# Patient Record
Sex: Female | Born: 1985
Health system: Southern US, Community
[De-identification: ages and names within clinical notes are randomized; demographics above are authoritative.]

## PROBLEM LIST (undated history)

## (undated) DIAGNOSIS — I10 Essential (primary) hypertension: Secondary | ICD-10-CM

## (undated) DIAGNOSIS — Z8632 Personal history of gestational diabetes: Secondary | ICD-10-CM

## (undated) HISTORY — PX: NO PAST SURGERIES: SHX2092

## (undated) HISTORY — DX: Personal history of gestational diabetes: Z86.32

---
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
## 2004-01-05 ENCOUNTER — Ambulatory Visit: Payer: Self-pay | Admitting: Internal Medicine

## 2004-05-18 ENCOUNTER — Emergency Department (HOSPITAL_COMMUNITY): Admission: EM | Admit: 2004-05-18 | Discharge: 2004-05-18 | Payer: Self-pay | Admitting: Emergency Medicine

## 2006-02-18 ENCOUNTER — Ambulatory Visit: Payer: Self-pay | Admitting: Internal Medicine

## 2006-07-31 ENCOUNTER — Emergency Department (HOSPITAL_COMMUNITY): Admission: EM | Admit: 2006-07-31 | Discharge: 2006-07-31 | Payer: Self-pay | Admitting: Emergency Medicine

## 2006-11-07 ENCOUNTER — Ambulatory Visit: Payer: Self-pay | Admitting: Internal Medicine

## 2006-11-07 DIAGNOSIS — G43009 Migraine without aura, not intractable, without status migrainosus: Secondary | ICD-10-CM | POA: Insufficient documentation

## 2006-11-14 ENCOUNTER — Ambulatory Visit: Payer: Self-pay | Admitting: Cardiology

## 2006-11-15 ENCOUNTER — Encounter (INDEPENDENT_AMBULATORY_CARE_PROVIDER_SITE_OTHER): Payer: Self-pay | Admitting: *Deleted

## 2006-12-05 ENCOUNTER — Ambulatory Visit: Payer: Self-pay | Admitting: Internal Medicine

## 2007-02-09 ENCOUNTER — Ambulatory Visit: Payer: Self-pay | Admitting: Internal Medicine

## 2007-03-27 ENCOUNTER — Telehealth (INDEPENDENT_AMBULATORY_CARE_PROVIDER_SITE_OTHER): Payer: Self-pay | Admitting: *Deleted

## 2007-04-30 ENCOUNTER — Telehealth (INDEPENDENT_AMBULATORY_CARE_PROVIDER_SITE_OTHER): Payer: Self-pay | Admitting: *Deleted

## 2007-05-04 ENCOUNTER — Telehealth (INDEPENDENT_AMBULATORY_CARE_PROVIDER_SITE_OTHER): Payer: Self-pay | Admitting: *Deleted

## 2007-05-08 ENCOUNTER — Ambulatory Visit: Payer: Self-pay | Admitting: Internal Medicine

## 2007-05-13 ENCOUNTER — Ambulatory Visit: Payer: Self-pay | Admitting: Internal Medicine

## 2007-05-13 ENCOUNTER — Encounter (INDEPENDENT_AMBULATORY_CARE_PROVIDER_SITE_OTHER): Payer: Self-pay | Admitting: *Deleted

## 2007-05-19 ENCOUNTER — Telehealth (INDEPENDENT_AMBULATORY_CARE_PROVIDER_SITE_OTHER): Payer: Self-pay | Admitting: *Deleted

## 2007-06-26 ENCOUNTER — Telehealth: Payer: Self-pay | Admitting: Internal Medicine

## 2007-07-15 ENCOUNTER — Ambulatory Visit: Payer: Self-pay | Admitting: Internal Medicine

## 2007-07-15 DIAGNOSIS — L2089 Other atopic dermatitis: Secondary | ICD-10-CM | POA: Insufficient documentation

## 2007-08-07 ENCOUNTER — Ambulatory Visit: Payer: Self-pay | Admitting: Internal Medicine

## 2007-08-27 ENCOUNTER — Ambulatory Visit: Payer: Self-pay | Admitting: Internal Medicine

## 2007-08-27 LAB — CONVERTED CEMR LAB
Beta hcg, urine, semiquantitative: NEGATIVE
Blood in Urine, dipstick: NEGATIVE
Glucose, Urine, Semiquant: NEGATIVE
Ketones, urine, test strip: NEGATIVE
pH: 7.5

## 2007-08-31 LAB — CONVERTED CEMR LAB
AST: 14 units/L (ref 0–37)
Albumin: 3.5 g/dL (ref 3.5–5.2)
Alkaline Phosphatase: 51 units/L (ref 39–117)
Basophils Absolute: 0.2 10*3/uL — ABNORMAL HIGH (ref 0.0–0.1)
Bilirubin, Direct: 0.1 mg/dL (ref 0.0–0.3)
CO2: 29 meq/L (ref 19–32)
Calcium: 9.3 mg/dL (ref 8.4–10.5)
Chloride: 104 meq/L (ref 96–112)
Eosinophils Absolute: 0.1 10*3/uL (ref 0.0–0.7)
Free T4: 1.2 ng/dL (ref 0.6–1.6)
Hemoglobin: 13.7 g/dL (ref 12.0–15.0)
Hgb A1c MFr Bld: 5.2 % (ref 4.6–6.0)
Monocytes Absolute: 0.7 10*3/uL (ref 0.1–1.0)
Platelets: 332 10*3/uL (ref 150–400)
Potassium: 3.5 meq/L (ref 3.5–5.1)
Sodium: 140 meq/L (ref 135–145)
TSH: 0.93 microintl units/mL (ref 0.35–5.50)
Total Protein: 6.7 g/dL (ref 6.0–8.3)

## 2007-10-13 ENCOUNTER — Ambulatory Visit: Payer: Self-pay | Admitting: Internal Medicine

## 2007-10-15 ENCOUNTER — Telehealth: Payer: Self-pay | Admitting: Internal Medicine

## 2007-10-20 ENCOUNTER — Ambulatory Visit: Payer: Self-pay | Admitting: Internal Medicine

## 2007-11-03 ENCOUNTER — Encounter (INDEPENDENT_AMBULATORY_CARE_PROVIDER_SITE_OTHER): Payer: Self-pay | Admitting: *Deleted

## 2007-11-03 ENCOUNTER — Ambulatory Visit: Payer: Self-pay | Admitting: Family Medicine

## 2007-11-04 ENCOUNTER — Ambulatory Visit: Payer: Self-pay | Admitting: Family Medicine

## 2007-11-04 ENCOUNTER — Ambulatory Visit: Payer: Self-pay | Admitting: Cardiology

## 2007-11-04 LAB — CONVERTED CEMR LAB
Basophils Relative: 0.4 % (ref 0.0–3.0)
Eosinophils Absolute: 0.1 10*3/uL (ref 0.0–0.7)
MCV: 92.5 fL (ref 78.0–100.0)
Monocytes Relative: 7.8 % (ref 3.0–12.0)
Neutrophils Relative %: 85.7 % — ABNORMAL HIGH (ref 43.0–77.0)
RBC: 4.28 M/uL (ref 3.87–5.11)
WBC: 13.2 10*3/uL — ABNORMAL HIGH (ref 4.5–10.5)

## 2007-11-06 ENCOUNTER — Ambulatory Visit: Payer: Self-pay | Admitting: Internal Medicine

## 2007-11-06 ENCOUNTER — Ambulatory Visit: Payer: Self-pay | Admitting: Family Medicine

## 2007-12-22 ENCOUNTER — Telehealth: Payer: Self-pay | Admitting: Internal Medicine

## 2008-01-27 ENCOUNTER — Telehealth: Payer: Self-pay | Admitting: Family Medicine

## 2008-01-28 ENCOUNTER — Encounter: Admission: RE | Admit: 2008-01-28 | Discharge: 2008-04-27 | Payer: Self-pay | Admitting: Family Medicine

## 2008-02-04 ENCOUNTER — Encounter: Payer: Self-pay | Admitting: Family Medicine

## 2008-02-08 ENCOUNTER — Ambulatory Visit: Payer: Self-pay | Admitting: Internal Medicine

## 2008-03-07 ENCOUNTER — Encounter: Payer: Self-pay | Admitting: Family Medicine

## 2008-03-11 ENCOUNTER — Encounter: Payer: Self-pay | Admitting: Family Medicine

## 2008-04-07 ENCOUNTER — Emergency Department (HOSPITAL_BASED_OUTPATIENT_CLINIC_OR_DEPARTMENT_OTHER): Admission: EM | Admit: 2008-04-07 | Discharge: 2008-04-07 | Payer: Self-pay | Admitting: Emergency Medicine

## 2008-04-11 ENCOUNTER — Telehealth (INDEPENDENT_AMBULATORY_CARE_PROVIDER_SITE_OTHER): Payer: Self-pay | Admitting: *Deleted

## 2008-04-11 ENCOUNTER — Ambulatory Visit: Payer: Self-pay | Admitting: Family Medicine

## 2008-05-05 ENCOUNTER — Ambulatory Visit: Payer: Self-pay | Admitting: Internal Medicine

## 2008-06-02 ENCOUNTER — Encounter (INDEPENDENT_AMBULATORY_CARE_PROVIDER_SITE_OTHER): Payer: Self-pay | Admitting: *Deleted

## 2008-06-02 ENCOUNTER — Ambulatory Visit: Payer: Self-pay | Admitting: Family Medicine

## 2008-06-03 ENCOUNTER — Telehealth: Payer: Self-pay | Admitting: Family Medicine

## 2008-06-09 ENCOUNTER — Encounter: Payer: Self-pay | Admitting: Family Medicine

## 2008-06-22 ENCOUNTER — Encounter: Admission: RE | Admit: 2008-06-22 | Discharge: 2008-06-22 | Payer: Self-pay | Admitting: Otolaryngology

## 2008-09-06 ENCOUNTER — Ambulatory Visit: Payer: Self-pay | Admitting: Family Medicine

## 2008-10-11 ENCOUNTER — Ambulatory Visit: Payer: Self-pay | Admitting: Internal Medicine

## 2008-10-11 DIAGNOSIS — J019 Acute sinusitis, unspecified: Secondary | ICD-10-CM | POA: Insufficient documentation

## 2008-12-07 ENCOUNTER — Ambulatory Visit: Payer: Self-pay | Admitting: Internal Medicine

## 2009-03-16 ENCOUNTER — Ambulatory Visit: Payer: Self-pay | Admitting: Internal Medicine

## 2009-03-21 ENCOUNTER — Ambulatory Visit: Payer: Self-pay | Admitting: Family

## 2010-02-16 ENCOUNTER — Ambulatory Visit
Admission: RE | Admit: 2010-02-16 | Discharge: 2010-02-16 | Payer: Self-pay | Source: Home / Self Care | Attending: Internal Medicine | Admitting: Internal Medicine

## 2010-02-20 NOTE — Assessment & Plan Note (Signed)
Summary: chest cold//takin OTC's//lch   Vital Signs:  Patient profile:   25 year old female Height:      65 inches Weight:      159.25 pounds BMI:     26.60 O2 Sat:      98 % on Room air Temp:     98.6 degrees F oral Pulse rate:   76 / minute Pulse rhythm:   regular Resp:     20 per minute BP sitting:   120 / 88  Vitals Entered By: Mervin Kung CMA (March 21, 2009 11:22 AM)  O2 Flow:  Room air CC: room 16  Cough since Friday. Comments Sore throat since Saturday and nasal congestion.   CC:  room 16  Cough since Friday.Marland Kitchen  History of Present Illness: Samantha Gould is a 25 year old female who presents with c/o nasal congestion/sinus pressure, cough (dry at times, wet at others)  Notes + HA and back soreness.  Denies fever.  has taken mucinex, theraflu, and amoxicillin which she started on friday 2/25 (left over from a sinus infection).  Notes that these symptoms started on Friday 2/25 with cough.  + sick contacts at church.    Allergies (verified): No Known Drug Allergies  Physical Exam  General:  Well-developed,well-nourished,in no acute distress; alert,appropriate and cooperative throughout examination Head:  Normocephalic and atraumatic without obvious abnormalities. No apparent alopecia or balding. Ears:  L TM with serous effusion,  R TM + erythema, + retraction Mouth:  mildy pharygeal erythema Neck:  No deformities, masses, or tenderness noted. Lungs:  Normal respiratory effort, chest expands symmetrically. Lungs are clear to auscultation, no crackles or wheezes. Heart:  Normal rate and regular rhythm. S1 and S2 normal without gallop, murmur, click, rub or other extra sounds.   Impression & Recommendations:  Problem # 1:  OTITIS MEDIA, RIGHT (ICD-382.9) Assessment New Not improving with amoxicillin with plan to change to cefdinir Her updated medication list for this problem includes:    Naprosyn 500 Mg Tabs (Naproxen) .Marland Kitchen... 1 by mouth bid    Cefdinir 300 Mg Caps  (Cefdinir) ..... One cap by mouth two times a day x 7 days  Problem # 2:  SINUSITIS - ACUTE-NOS (ICD-461.9) Assessment: New Plan cefdinir for an addition 7 days The following medications were removed from the medication list:    Nasacort Aq 55 Mcg/act Aers (Triamcinolone acetonide(nasal)) .Marland Kitchen... 2 sprays each nostril once daily Her updated medication list for this problem includes:    Cefdinir 300 Mg Caps (Cefdinir) ..... One cap by mouth two times a day x 7 days    Tessalon Perles 100 Mg Caps (Benzonatate) ..... One cap by mouth three times a day as needed cough  Complete Medication List: 1)  Depo Shots  2)  Flexeril 10 Mg Tabs (Cyclobenzaprine hcl) .Marland Kitchen.. 1 at night as needed 3)  Treximet 85-500 Mg Tabs (Sumatriptan-naproxen sodium) .Marland Kitchen.. 1 by mouth  w/ onset of migraine, repeat in 2 hours if needed. no more than 2 tabs a day 4)  Vicodin 5-500 Mg Tabs (Hydrocodone-acetaminophen) .Marland Kitchen.. 1 tab by mouth q4-6 as needed for pain 5)  Naprosyn 500 Mg Tabs (Naproxen) .Marland Kitchen.. 1 by mouth bid 6)  Cefdinir 300 Mg Caps (Cefdinir) .... One cap by mouth two times a day x 7 days 7)  Patanase Nasal Spray  .Marland Kitchen.. 2 sprays each nostril daily 8)  Tessalon Perles 100 Mg Caps (Benzonatate) .... One cap by mouth three times a day as needed cough  Patient Instructions: 1)  Call if fever over 101,if symptoms worsen or if they do not improve.  Prescriptions: TESSALON PERLES 100 MG CAPS (BENZONATATE) one cap by mouth three times a day as needed cough  #30 x 0   Entered and Authorized by:   Lemont Fillers FNP   Signed by:   Lemont Fillers FNP on 03/21/2009   Method used:   Electronically to        [facility] [phone][facility] (retail)       [address]       [place], [place]  [address]       Ph: [phone]       Fax: [phone]   RxID:   [id number] CEFDINIR 300 MG CAPS (CEFDINIR) one cap by mouth two times a day x 7 days  #14 x 0   Entered and Authorized by:   Lemont Fillers FNP   Signed by:   Lemont Fillers FNP on 03/21/2009   Method used:   [facility]ronically to        [facility] [phone]* (retail)       [address]       [place], [place]  [address]       Ph: [phone]       Fax: [phone]   RxID:   [id number]   Current Allergies (reviewed today): No known allergies

## 2010-02-20 NOTE — Letter (Signed)
Summary: Out of Work  [facility] [employer]  [address] [place], [place] [address]   Phone: [phone]  Fax: [phone]    March 21, 2009   Employee:  RONNICA DREESE    To Whom It May Concern:   For Medical reasons, please excuse the above named employee from work for the following dates:  Start:   3/1  End:   3/3  If you need additional information, please feel free to contact our office.         Sincerely,    Lemont Fillers FNP

## 2010-02-20 NOTE — Assessment & Plan Note (Signed)
Summary: DEPO SHOT//PH  Nurse Visit   Allergies: No Known Drug Allergies  Medication Administration  Injection # 1:    Medication: Depo-Provera 150mg     Diagnosis: CONTRACEPTIVE MANAGEMENT (ICD-V25.09)    Route: IM    Site: R deltoid    Exp Date: 03/22/2011    Lot #: [id number]    Mfr: Francisca December    Given by: Doristine Devoid (March 16, 2009 12:18 PM)  Orders Added: 1)  Depo-Provera 150mg  [J1055] 2)  Admin of Therapeutic Inj  intramuscular or subcutaneous [96[address]]   Medication Administration  Injection # 1:    Medication: Depo-Provera 150mg     Diagnosis: CONTRACEPTIVE MANAGEMENT (ICD-V25.09)    Route: IM    Site: R deltoid    Exp Date: 03/22/2011    Lot #: [id number]    Mfr: Francisca December    Given by: Doristine Devoid (March 16, 2009 12:18 PM)  Orders Added: 1)  Depo-Provera 150mg  [J1055] 2)  Admin of Therapeutic Inj  intramuscular or subcutaneous [[id number]]

## 2010-02-28 NOTE — Assessment & Plan Note (Signed)
Summary: migraines, talk abt tonsil removal///sph   Vital Signs:  Patient profile:   25 year old female Height:      65 inches Weight:      169.38 pounds BMI:     28.29 Temp:     98.2 degrees F oral Pulse rate:   64 / minute Pulse rhythm:   regular BP sitting:   120 / 84  (left arm) Cuff size:   regular  Vitals Entered By: Army Fossa CMA (February 16, 2010 3:42 PM) CC: Pr here w/ c/o sinus infection? Comments -drainage -HA - nasal congestion x 4 days  discuss [facility]   History of Present Illness: 4-5 days history of frontal headaches, sinus congestion, postnasal drip . DayQuil and NyQuil not helping much. Also wonders if she needs her tonsils removed, she reports an average of 4 or 5 URI-type of episodes a year, every time she has moderate tonsil pain.       Current Medications (verified): 1)  Treximet 85-500 Mg Tabs (Sumatriptan-Naproxen Sodium) .Marland Kitchen.. 1 By Mouth  W/ Onset of Migraine, Repeat in 2 Hours If Needed. No More Than 2 Tabs A Day 2)  Patanase Nasal Spray .Marland Kitchen.. 2 Sprays Each Nostril Daily 3)  Tri-Sprintec 0.18/0.215/0.25 Mg-35 Mcg Tabs (Norgestim-Eth Estrad Triphasic)  Allergies (verified): No Known Drug Allergies  Past History:  Past Medical History: Reviewed history from 08/27/2007 and no changes required. G0P0 Headache, migraines  Past Surgical History: Reviewed history from 05/13/2007 and no changes required. no  Social History: Single lives by self tobacco-- << 1/2 ppd ETOH--no  Review of Systems General:  Complains of fatigue; denies fever. Resp:  Denies cough, shortness of breath, and wheezing. GI:  Denies diarrhea, nausea, and vomiting. MS:  some neck soreness, no generalized myalgias.  Physical Exam  General:  alert and well-developed.  nontoxic Head:  face symmetric, maxillary sinuses not tender, frontal sinuses slightly tender Ears:  R ear normal and L ear normal.   Nose:  quite congested Mouth:   tonsils normal size, currently no redness or discharge Lungs:  Normal respiratory effort, chest expands symmetrically. Lungs are clear to auscultation, no crackles or wheezes. Heart:  normal rate, regular rhythm, and no murmur.     Impression & Recommendations:  Problem # 1:  SINUSITIS - ACUTE-NOS (ICD-461.9) symptoms consistent with sinusitis see  instructions  Tonsillectomy? the exam is pretty benign, she does not have more URIs than the  average person, I don't think is appropriate to proceed with a tonsillectomy. she already discussed the issue with a ENT , they did not recommend a tonsillectomy  Her updated medication list for this problem includes:    Amoxicillin 500 Mg Tabs (Amoxicillin) .Marland Kitchen... 2 by mouth two times a day  Complete Medication List: 1)  Treximet 85-500 Mg Tabs (Sumatriptan-naproxen sodium) .Marland Kitchen.. 1 by mouth  w/ onset of migraine, repeat in 2 hours if needed. no more than 2 tabs a day 2)  Patanase Nasal Spray  .Marland Kitchen.. 2 sprays each nostril daily 3)  Tri-sprintec 0.18/0.215/0.25 Mg-35 Mcg Tabs (Norgestim-eth estrad triphasic) 4)  Amoxicillin 500 Mg Tabs (Amoxicillin) .... 2 by mouth two times a day 5)  Prednisone 10 Mg Tabs (Prednisone) .... 4 by mouth once daily x 2;  3 by mouth once daily  x2;  2 by mouth once daily x 2 , 1 by mouth once daily x2  Patient Instructions: 1)  rest, fluids, tylenol 2)  prednisone  3)  amoxicillin 4)  mucinex D two times a day x few days 5)  call if no better next week, call if symptoms severe Prescriptions: TREXIMET 85-500 MG TABS (SUMATRIPTAN-NAPROXEN SODIUM) 1 by mouth  w/ onset of migraine, repeat in 2 hours if needed. No more than 2 tabs a day  #12 x 1   Entered and Authorized by:   Elita Quick E. Jazilyn Siegenthaler MD   Signed by:   Nolon Rod. Tanara Turvey MD on 02/16/2010   Method used:   Print then Give to Patient   RxID:   [id number] AMOXICILLIN 500 MG TABS (AMOXICILLIN) 2 by mouth two times a day  #40 x 0   Entered and Authorized by:   Nolon Rod. Paolo Okane MD    Signed by:   Nolon Rod. Nicole Defino MD on 02/16/2010   Method used:   Print then Give to Patient   RxID:   [id number] PREDNISONE 10 MG TABS (PREDNISONE) 4 by mouth once daily x 2;  3 by mouth once daily  x2;  2 by mouth once daily x 2 , 1 by mouth once daily x2  #20 x 0   Entered and Authorized by:   Nolon Rod. Nahdia Doucet MD   Signed by:   Nolon Rod. Lathon Adan MD on 02/16/2010   Method used:   Print then Give to Patient   RxID:   [id number]    Orders Added: 1)  Est. Patient Level III [94854]

## 2010-03-02 ENCOUNTER — Telehealth: Payer: Self-pay | Admitting: Internal Medicine

## 2010-03-08 NOTE — Progress Notes (Signed)
Summary: pt lost rx -needs new rx called in   Phone Note Call from Patient Call back at [phone]   Caller: Patient Summary of Call: patient was seen 2 weeks ago ([id number])& given rx for migraine she misplaced rx - she has a migraine - [facility] - please call her when called in .Marland KitchenOkey Regal [place]  March 02, 2010 9:10 AM    Follow-up for Phone Call        she received 3 prescriptions. Okay to call Treximet Follow-up by: Burnice Vassel E. Verity Gilcrest MD,  March 02, 2010 9:39 AM  Additional Follow-up for Phone Call Additional follow up Details #1::        Left detailed message that I sent Treximet to [facility] Additional Follow-up by: Army Fossa CMA,  March 02, 2010 9:56 AM    Prescriptions: TREXIMET 85-500 MG TABS (SUMATRIPTAN-NAPROXEN SODIUM) 1 by mouth  w/ onset of migraine, repeat in 2 hours if needed. No more than 2 tabs a day  #12 x 1   Entered by:   Army Fossa CMA   Authorized by:   Nolon Rod. Hailyn Zarr MD   Signed by:   Army Fossa CMA on 03/02/2010   Method used:   Electronically to        [facility] [phone]* (retail)       [address]       [place], [place]  [address]       Ph: [phone]       Fax: [phone]   RxID:   [id number]

## 2010-06-29 ENCOUNTER — Ambulatory Visit (INDEPENDENT_AMBULATORY_CARE_PROVIDER_SITE_OTHER): Payer: Managed Care, Other (non HMO) | Admitting: Internal Medicine

## 2010-06-29 ENCOUNTER — Encounter: Payer: Self-pay | Admitting: Internal Medicine

## 2010-06-29 ENCOUNTER — Ambulatory Visit
Admission: RE | Admit: 2010-06-29 | Discharge: 2010-06-29 | Disposition: A | Discharge status: Home / Self Care | Payer: Managed Care, Other (non HMO) | Source: Ambulatory Visit | Attending: Internal Medicine | Admitting: Internal Medicine

## 2010-06-29 VITALS — BP 140/90 | HR 84 | Temp 98.3°F | Wt 171.8 lb

## 2010-06-29 DIAGNOSIS — G43009 Migraine without aura, not intractable, without status migrainosus: Secondary | ICD-10-CM

## 2010-06-29 MED ORDER — HYDROCODONE-ACETAMINOPHEN 5-500 MG PO TABS: 1.0000 | ORAL_TABLET | ORAL | Status: DC | PRN

## 2010-06-29 MED ORDER — PREDNISONE 10 MG PO TABS: ORAL_TABLET | ORAL | Status: AC

## 2010-06-29 NOTE — Progress Notes (Signed)
  Subjective:    Patient ID: Samantha Gould, female    DOB: 1985/06/07, 25 y.o.   MRN: [id number]  HPI c/o headaches for 2 weeks, headache has become more steady in the last few days. Headache is located in the face around the eyes and her forehead bilaterally, there is no associated symptoms like nausea, if feels different from previous migraines, her migraine pain is usually a more posterior ache. She has developed a nose congestion and thinks it may be a sinus infection.  Past Medical History  Diagnosis Date  . Headache   . Migraine    No past surgical history on file.  Social History: Single lives by self tobacco-- << 1/2 ppd ETOH--no  Review of Systems Denies fevers, no cough or chest congestion. Admits to itchy eyes and itchy nose. She is also hurting in the back of head or neck and he feels somehow Brett Canales, She is also very light sensitive, these would be consistent by migraines but with her previous headaches she never had photosensitivity. On further questioning, she has no diplopia but her left visual field is somewhat blurred. No nausea, vomiting, diarrhea. She's not taking any new medicines    Objective:   Physical Exam  Constitutional: She appears well-developed and well-nourished.       No distress, afebrile, she prefers the lights of the room to be off  HENT:  Head: Normocephalic and atraumatic.       Face symmetric, slightly tender at the left maxillary otherwise no TTP. Tympanic membrane on the right is moderately  bulge slightly pink, on the left --->  normal. Nose slightly congested Throat without redness or discharge.  Eyes: EOM are normal. Pupils are equal, round, and reactive to light.       Limited funduscopy without dilatation show normal vessels and no edema   Neck:       Neck is supple,  full range of motion although complaining of pain with passive neck flexion  Cardiovascular: Normal rate, regular rhythm and normal heart sounds.   No murmur  heard. Pulmonary/Chest: Effort normal and breath sounds normal.  Abdominal: Soft. Bowel sounds are normal. She exhibits no distension. There is no tenderness. There is no rebound and no guarding.  Neurological:       Alert, oriented x3, motor exam symmetric, reflexes symmetric, face without deficits. Cranial nerves intact. Normal plantar reflexes           Assessment & Plan:

## 2010-06-29 NOTE — Patient Instructions (Addendum)
Please call the patient: MRI normal Plan: Prednisone as prescribed If the headache is not responding to Tylenol or Motrin, she can take Vicodin. ER if symptoms of severe or not better in 2 days.  Left detailed msg on pt voicemail w/ instructions and rx sent to pharmacy. Doristine Devoid

## 2010-06-29 NOTE — Assessment & Plan Note (Addendum)
Long  history of headaches, presents today with persistent headache for 2 weeks. Importantly, she has subjective neck stiffness and photophobia. She is afebrile. She also complained of a blurred  left visual field. Neurological exam essentially normal. Specifically pupils are equal and reactive face is symmetric. D/w neuro over the phone, they suggested a MRI. Dx is possibly migraine. Will call pt w/ results (cell [phone]) ---------------------------------- Addendum, MRI negative. No sinus disease. With these results, she most likely has a migraine headache. Recommend rest, fluids, prednisone, Vicodin as needed. ER if no better in 2 days or if sx severe Will refer her to neurology Dr Anne Hahn who I spoke with earlier today.

## 2010-07-02 ENCOUNTER — Telehealth: Payer: Self-pay | Admitting: *Deleted

## 2010-07-02 NOTE — Telephone Encounter (Signed)
Message copied by Leanne Lovely on Mon Jul 02, 2010  4:11 PM ------      Message from: Wanda Plump      Created: Sun Jul 01, 2010 10:23 AM       Check on her

## 2010-07-02 NOTE — Telephone Encounter (Signed)
Message left for patient to return my call.  

## 2010-07-03 NOTE — Telephone Encounter (Signed)
Pt states that she is doing great!

## 2010-07-03 NOTE — Telephone Encounter (Signed)
Message left for patient to return my call.  

## 2011-02-11 ENCOUNTER — Ambulatory Visit (INDEPENDENT_AMBULATORY_CARE_PROVIDER_SITE_OTHER): Payer: Managed Care, Other (non HMO) | Admitting: Internal Medicine

## 2011-02-11 DIAGNOSIS — Z0000 Encounter for general adult medical examination without abnormal findings: Secondary | ICD-10-CM

## 2011-02-11 NOTE — Progress Notes (Signed)
  Subjective:    Patient ID: Samantha Gould, female    DOB: Jun 16, 1985, 26 y.o.   MRN: [id number]  HPI CPX Needs a health  screening form completed Doing great, has not need any migraine medication in long-time. She is very active and eating very healthy.  Past medical history Migraine headaches  Past surgical history None  Social History:  Single , GOPO Lives w/a room mate Works @ [employer] tobacco-- 1/2 ppd , quit a while back ETOH--no Diet-- healthy Exercise-- every day  Familly history: DM-- M, GM CAD--no Colon ca--no Breast ca--no   Review of Systems  Constitutional: Negative for fever and fatigue.  Respiratory: Negative for cough and shortness of breath.   Cardiovascular: Negative for chest pain and leg swelling.  Gastrointestinal: Negative for abdominal pain and blood in stool.  Psychiatric/Behavioral:       No depression or anxiety        Objective:   Physical Exam  Constitutional: She is oriented to person, place, and time. She appears well-developed and well-nourished. No distress.  HENT:  Head: Normocephalic and atraumatic.  Neck: No thyromegaly present.  Cardiovascular: Normal rate, regular rhythm and normal heart sounds.   No murmur heard. Pulmonary/Chest: Effort normal and breath sounds normal. No respiratory distress. She has no wheezes. She has no rales.  Abdominal: Soft. She exhibits no distension. There is no tenderness.  Musculoskeletal: She exhibits no edema.  Lymphadenopathy:    She has no cervical adenopathy.  Neurological: She is alert and oriented to person, place, and time.  Skin: She is not diaphoretic.  Psychiatric: She has a normal mood and affect. Her behavior is normal. Judgment and thought content normal.       Assessment & Plan:

## 2011-02-11 NOTE — Assessment & Plan Note (Addendum)
Td ?. At gyn recently? See instructions Flu shot , declined Sees gyn Diet-exercise discussed  Forms completed

## 2011-02-11 NOTE — Patient Instructions (Signed)
Be sure you had a tetanus shot no more than 10 years ago

## 2011-02-12 ENCOUNTER — Encounter: Payer: Self-pay | Admitting: Internal Medicine

## 2011-03-29 ENCOUNTER — Telehealth: Payer: Self-pay | Admitting: Internal Medicine

## 2011-03-29 NOTE — Telephone Encounter (Signed)
discussed with patient and she stated she was getting her Depo provera at the GYN but we are closer and she wanted to know if she could go ahead and start getting them done here again. Her last injection was 01/11/11 per correspondence from GYN which make her due today. Please advise   KP

## 2011-03-29 NOTE — Telephone Encounter (Signed)
Ok to get it here

## 2011-03-29 NOTE — Telephone Encounter (Signed)
Patient would like to come back to [place] to get a depo shot. Please review & advise  Thanks  Dennie Bible ph# [phone]

## 2011-03-29 NOTE — Telephone Encounter (Signed)
Scheduled KP

## 2011-04-01 ENCOUNTER — Ambulatory Visit (INDEPENDENT_AMBULATORY_CARE_PROVIDER_SITE_OTHER): Payer: Managed Care, Other (non HMO) | Admitting: *Deleted

## 2011-04-01 DIAGNOSIS — Z309 Encounter for contraceptive management, unspecified: Secondary | ICD-10-CM

## 2011-04-01 MED ORDER — MEDROXYPROGESTERONE ACETATE 150 MG/ML IM SUSP
150.0000 mg | Freq: Once | INTRAMUSCULAR | Status: AC
  Administered 2011-04-01: 150 mg via INTRAMUSCULAR

## 2011-08-05 ENCOUNTER — Encounter: Payer: Self-pay | Admitting: Internal Medicine

## 2011-08-05 ENCOUNTER — Ambulatory Visit (INDEPENDENT_AMBULATORY_CARE_PROVIDER_SITE_OTHER): Payer: Managed Care, Other (non HMO) | Admitting: Internal Medicine

## 2011-08-05 ENCOUNTER — Ambulatory Visit: Payer: Managed Care, Other (non HMO) | Admitting: Internal Medicine

## 2011-08-05 VITALS — BP 118/80 | HR 77 | Temp 97.8°F | Wt 163.0 lb

## 2011-08-05 DIAGNOSIS — G4700 Insomnia, unspecified: Secondary | ICD-10-CM

## 2011-08-05 DIAGNOSIS — J019 Acute sinusitis, unspecified: Secondary | ICD-10-CM

## 2011-08-05 MED ORDER — FLUTICASONE PROPIONATE 50 MCG/ACT NA SUSP: 2.0000 | Freq: Every day | NASAL | Status: DC

## 2011-08-05 MED ORDER — ZOLPIDEM TARTRATE 10 MG PO TABS: 10.0000 mg | ORAL_TABLET | Freq: Every evening | ORAL | Status: DC | PRN

## 2011-08-05 MED ORDER — AMOXICILLIN 500 MG PO CAPS: 1000.0000 mg | ORAL_CAPSULE | Freq: Two times a day (BID) | ORAL | Status: AC

## 2011-08-05 NOTE — Patient Instructions (Addendum)
Flonase 2 sprays in each side of the nose every night Over-the-counter Claritin 10 mg one at bedtime until you feel better If you are not improving in the next week, please to start amoxicillin (an antibiotic). If you don't improve after that, let me know. ------------------ Ambien as needed to help with sleep.

## 2011-08-05 NOTE — Assessment & Plan Note (Addendum)
Having  difficulty staying asleep. Good sleeping habits discuss, information provided. Trial with Ambien. If not better needs to let me know.

## 2011-08-05 NOTE — Assessment & Plan Note (Signed)
Allergies versus sinusitis?. See instructions

## 2011-08-05 NOTE — Progress Notes (Signed)
  Subjective:    Patient ID: Samantha Gould, female    DOB: Mar 04, 1985, 26 y.o.   MRN: [id number]  HPI Here to discuss the following issues: 3 weeks history of sinus congestion and pressure, around the nose. Symptoms have been almost exclusively at night. Denies being exposed to any new allergens like a new pet or new carpet. Once she gets congested then she gets like a generalized headache different from her migraines because she does not have nausea or vomiting.  Also, having difficulty staying asleep for few months, has tried several over-the-counter meds without much success. Her sleep is broken. Denies watching TV at night, denies increase in normal stress.  Past medical history Migraine headaches  Past surgical history None  Social History:   Single , GOPO Lives w/a room mate Works @ [employer] tobacco-- 1/2 ppd , quit a while back ETOH--no   Familly history: DM-- M, GM CAD--no Colon ca--no Breast ca--no   Review of Systems no cough, chest congestion. No fever chills. No runny nose, sore throat, postnasal dripping or nasal discharge.    Objective:   Physical Exam  General -- alert, well-developed. No apparent distress.  HEENT -- TMs normal, throat w/o redness, face symmetric and not tender to palpation, nose slt  Congested; EOMI, PERRLA Lungs -- normal respiratory effort, no intercostal retractions, no accessory muscle use, and normal breath sounds.   Heart-- normal rate, regular rhythm, no murmur, and no gallop.   Neurologic-- alert & oriented X3 and strength normal in all extremities. Psych-- Cognition and judgment appear intact. Alert and cooperative with normal attention span and concentration.  not anxious appearing and not depressed appearing.      Assessment & Plan:

## 2011-10-21 ENCOUNTER — Ambulatory Visit (INDEPENDENT_AMBULATORY_CARE_PROVIDER_SITE_OTHER): Payer: Self-pay | Admitting: *Deleted

## 2011-10-21 DIAGNOSIS — IMO0001 Reserved for inherently not codable concepts without codable children: Secondary | ICD-10-CM

## 2011-10-21 DIAGNOSIS — Z309 Encounter for contraceptive management, unspecified: Secondary | ICD-10-CM

## 2011-10-21 MED ORDER — MEDROXYPROGESTERONE ACETATE 150 MG/ML IM SUSP
150.0000 mg | Freq: Once | INTRAMUSCULAR | Status: AC
  Administered 2011-10-21: 150 mg via INTRAMUSCULAR

## 2011-12-09 ENCOUNTER — Encounter: Payer: Self-pay | Admitting: Internal Medicine

## 2011-12-09 ENCOUNTER — Ambulatory Visit (INDEPENDENT_AMBULATORY_CARE_PROVIDER_SITE_OTHER): Payer: BC Managed Care – PPO | Admitting: Internal Medicine

## 2011-12-09 VITALS — BP 110/70 | HR 86 | Temp 98.4°F | Wt 165.8 lb

## 2011-12-09 DIAGNOSIS — R5381 Other malaise: Secondary | ICD-10-CM

## 2011-12-09 DIAGNOSIS — R5383 Other fatigue: Secondary | ICD-10-CM

## 2011-12-09 NOTE — Patient Instructions (Addendum)
Review and correct the record as indicated. Please share record with all medical staff seen.   If you activate My Chart; the results can be released to you as soon as they populate from the lab. If you choose not to use this program; the labs have to be reviewed, copied & mailed   causing a delay in getting the results to you.  

## 2011-12-09 NOTE — Progress Notes (Signed)
Subjective:    Patient ID: Samantha Gould, female    DOB: 1985/03/29, 26 y.o.   MRN: [id number]  HPI FATIGUE: Symptoms began in September and have persisted. Symptoms seem to be progressing over the last several weeks. She feels that she is sleeping well but becomes fatigued after being up for approximately 2 hours. This is described as a physical, not motivatonal fatigue. She has associated exertional dyspnea on occasion. She also has some pedal edema.  She states her mother is told that she snored while they were on a trip. She also has daytime sleepiness.. She believes that she has awakened herself 1-2 times per month having stopped breathing. She is also noted some night sweats.  She describes intolerance to heat.                                                                          PMH/ FH of thyroid disease: no . Her father had ENT surgery possibly for sleep apnea and apparently uses CPAP . MGF used CPAP as well    Review of Systems Fever/ chills : no                                                                                       Vision changes ( blurred/ double/ loss): no                                                                                                 Hoarseness or swallowing dysfunction: no                                                                                         Bowel changes( constipation/ diarrhea): no  Weight change: no   Exertional chest pain:no  Cough: no Hemoptysis: no  New medications: no  PND:no  Melena/ rectal bleeding:no  Adenopathy:no  Skin / hair / nail changes: no  Feeling depressed: no  Anhedonia:no Altered appetite:no  Abnormal bruising / bleeding or enlarged lymph nodes: no        Objective:   Physical Exam Gen.: Healthy and well-nourished in appearance. Alert, appropriate and cooperative throughout exam. Head: Normocephalic  without obvious abnormalities Eyes: No corneal or conjunctival inflammation noted.  Extraocular motion intact.  Ears: External  ear exam reveals no significant lesions or deformities. Canals clear .TMs normal. Hearing is grossly normal bilaterally. Nose: External nasal exam reveals no deformity or inflammation. Nasal mucosa are pink and moist. Polyp on L No lesions or exudates noted.   Mouth: Oral mucosa and oropharynx reveal no lesions or exudates. Teeth in good repair. Neck: No deformities, masses, or tenderness noted. Range of motion &Thyroid normal. Lungs: Normal respiratory effort; chest expands symmetrically. Lungs are clear to auscultation without rales, wheezes, or increased work of breathing. Heart: Normal rate and rhythm. Normal S1 and S2. No gallop, click, or rub. S4 w/o murmur. Abdomen: Bowel sounds normal; abdomen soft and nontender. No masses, organomegaly or hernias noted.                                                                             Musculoskeletal/extremities: No clubbing, cyanosis, edema, or deformity noted. Range of motion  normal .Tone & strength  normal.Joints normal. Nail health  Good.Pes planus Vascular: Carotid, radial artery, dorsalis pedis and  posterior tibial pulses are full and equal. No bruits present. Neurologic: Alert and oriented x3. Deep tendon reflexes symmetrical and normal.          Skin: Intact without suspicious lesions or rashes.Large congenital nevus RU& LQ Lymph: No cervical, axillary lymphadenopathy present.Ticklish Psych: Mood and affect are normal. Normally interactive                                                                                         Assessment & Plan:   #1 fatigue, persistent and progressive. History suggestive of possible sleep apnea. Positive family history of same  Plan: If labs are negative; referral for sleep evaluation indicated

## 2011-12-10 LAB — HEPATIC FUNCTION PANEL
ALT: 19 U/L (ref 0–35)
Alkaline Phosphatase: 65 U/L (ref 39–117)

## 2011-12-10 LAB — CBC WITH DIFFERENTIAL/PLATELET
Basophils Relative: 0.7 % (ref 0.0–3.0)
Eosinophils Absolute: 0.3 10*3/uL (ref 0.0–0.7)
Eosinophils Relative: 2.9 % (ref 0.0–5.0)
Lymphs Abs: 2.5 10*3/uL (ref 0.7–4.0)
MCV: 93.4 fl (ref 78.0–100.0)
Monocytes Absolute: 0.5 10*3/uL (ref 0.1–1.0)
Neutro Abs: 5.4 10*3/uL (ref 1.4–7.7)
RBC: 4.38 Mil/uL (ref 3.87–5.11)

## 2011-12-10 LAB — BASIC METABOLIC PANEL
BUN: 11 mg/dL (ref 6–23)
Calcium: 8.8 mg/dL (ref 8.4–10.5)
Chloride: 108 mEq/L (ref 96–112)
Creatinine, Ser: 0.8 mg/dL (ref 0.4–1.2)
GFR: 104.49 mL/min (ref >60.00)
Glucose, Bld: 98 mg/dL (ref 70–99)

## 2011-12-10 LAB — T3, FREE: T3, Free: 3 pg/mL (ref 2.3–4.2)

## 2012-02-02 ENCOUNTER — Encounter (HOSPITAL_BASED_OUTPATIENT_CLINIC_OR_DEPARTMENT_OTHER): Payer: Self-pay

## 2012-02-02 ENCOUNTER — Emergency Department (HOSPITAL_BASED_OUTPATIENT_CLINIC_OR_DEPARTMENT_OTHER)
Admission: EM | Admit: 2012-02-02 | Discharge: 2012-02-02 | Disposition: A | Discharge status: Home / Self Care | Payer: BC Managed Care – PPO | Attending: Emergency Medicine | Admitting: Emergency Medicine

## 2012-02-02 DIAGNOSIS — J029 Acute pharyngitis, unspecified: Secondary | ICD-10-CM | POA: Insufficient documentation

## 2012-02-02 DIAGNOSIS — R05 Cough: Secondary | ICD-10-CM | POA: Insufficient documentation

## 2012-02-02 DIAGNOSIS — R059 Cough, unspecified: Secondary | ICD-10-CM | POA: Insufficient documentation

## 2012-02-02 DIAGNOSIS — IMO0001 Reserved for inherently not codable concepts without codable children: Secondary | ICD-10-CM | POA: Insufficient documentation

## 2012-02-02 DIAGNOSIS — R509 Fever, unspecified: Secondary | ICD-10-CM | POA: Insufficient documentation

## 2012-02-02 DIAGNOSIS — J3489 Other specified disorders of nose and nasal sinuses: Secondary | ICD-10-CM | POA: Insufficient documentation

## 2012-02-02 DIAGNOSIS — B349 Viral infection, unspecified: Secondary | ICD-10-CM

## 2012-02-02 DIAGNOSIS — B9789 Other viral agents as the cause of diseases classified elsewhere: Secondary | ICD-10-CM | POA: Insufficient documentation

## 2012-02-02 DIAGNOSIS — F17200 Nicotine dependence, unspecified, uncomplicated: Secondary | ICD-10-CM | POA: Insufficient documentation

## 2012-02-02 DIAGNOSIS — Z8669 Personal history of other diseases of the nervous system and sense organs: Secondary | ICD-10-CM | POA: Insufficient documentation

## 2012-02-02 LAB — RAPID STREP SCREEN (MED CTR MEBANE ONLY): Streptococcus, Group A Screen (Direct): NEGATIVE

## 2012-02-02 MED ORDER — IBUPROFEN 800 MG PO TABS
800.0000 mg | ORAL_TABLET | Freq: Once | ORAL | Status: AC
  Administered 2012-02-02: 800 mg via ORAL
  Filled 2012-02-02: qty 1

## 2012-02-02 NOTE — ED Provider Notes (Signed)
History     CSN: [id number]  Arrival date & time 02/02/12  0903   First MD Initiated Contact with Patient 02/02/12 [phone]      Chief Complaint  Patient presents with  . Sore Throat  . Fatigue  . Nasal Congestion  . Fever    (Consider location/radiation/quality/duration/timing/severity/associated sxs/prior treatment) HPI Pt presents with c/o cough, congestion and sore throat.  She states sore throat began yesterday and cough began today.  Cough is nonproductive.  + subjective fever.  No vomiting or chest pain.  Also c/o generalized body aches.  No specific sick contacts.  There are no other associated systemic symptoms, there are no other alleviating or modifying factors. No difficulty swallowing or breathing, but pain with swallowing.  She has tried some OTC congestion medications  Past Medical History  Diagnosis Date  . Migraine     Past Surgical History  Procedure Date  . No past surgeries     Family History  Problem Relation Age of Onset  . Migraines Sister   . Diabetes    . Breast cancer Maternal Grandmother   . Sleep apnea Father   . Sleep apnea Maternal Grandfather     History  Substance Use Topics  . Smoking status: Current Every Day Smoker  . Smokeless tobacco: Not on file     Comment: 2-3 cig/ day on average  . Alcohol Use: No    OB History    Grav Para Term Preterm Abortions TAB SAB Ect Mult Living                  Review of Systems ROS reviewed and all otherwise negative except for mentioned in HPI  Allergies  Review of patient's allergies indicates no known allergies.  Home Medications   Current Outpatient Rx  Name  Route  Sig  Dispense  Refill  . OVER THE COUNTER MEDICATION   Oral   Take 1 tablet by mouth 2 times daily at 12 noon and 4 pm. Generic Sinus pressure medication           BP 121/80  Pulse 93  Temp 98.7 F (37.1 C) (Oral)  Resp 20  Ht 5\' 5"  (1.651 m)  Wt 155 lb (70.308 kg)  BMI 25.79 kg/m2  SpO2 98%  LMP  01/13/2012 Vitals reviewed Physical Exam Physical Examination: General appearance - alert, well appearing, and in no distress Mental status - alert, oriented to person, place, and time Eyes -no scleral icterus, no conjunctival injection Mouth - MMM, OP with moderate erythema, palate symmetric, uvula midline, no exudate Neck - supple, no significant adenopathy Chest - clear to auscultation, no wheezes, rales or rhonchi, symmetric air entry Heart - normal rate, regular rhythm, normal S1, S2, no murmurs, rubs, clicks or gallops Abdomen - soft, nontender, nondistended, no masses or organomegaly Extremities - peripheral pulses normal, no pedal edema, no clubbing or cyanosis Skin - normal coloration and turgor, no rashes, brisk cap refill  ED Course  Procedures (including critical care time)   Labs Reviewed  RAPID STREP SCREEN   No results found.   1. Viral pharyngitis   2. Viral infection       MDM  Pt presnting with c/o sore throat, myalgias and nonproductive cough.  Pt is overall nontoxic and well hydrated, vitals are reassuring.  Strep screen negative.  Lungs are clear and patient has no increased respiratory effort- low suspicion for pneumonia at this time.  Likely viral process/influenza like illness.  Pt advised  symptomatic care, ibuprofen, increased fluid intake.  Discharged with strict return precautions.  Pt agreeable with plan.        Ethelda Chick, MD 02/02/12 [phone]

## 2012-02-02 NOTE — ED Notes (Signed)
Pt states that she had onset of congestion on Friday with increasing fatigue/malaise, and fever yesterday.  Pt states that she also has severely sore throat and swollen tonsils.  Pt is in nad at time of triage, handling secretions well, hoarse voice.

## 2012-02-03 ENCOUNTER — Ambulatory Visit (INDEPENDENT_AMBULATORY_CARE_PROVIDER_SITE_OTHER): Payer: BC Managed Care – PPO | Admitting: Internal Medicine

## 2012-02-03 ENCOUNTER — Encounter: Payer: Self-pay | Admitting: Internal Medicine

## 2012-02-03 VITALS — BP 116/80 | HR 90 | Temp 98.4°F | Wt 166.0 lb

## 2012-02-03 DIAGNOSIS — D729 Disorder of white blood cells, unspecified: Secondary | ICD-10-CM

## 2012-02-03 DIAGNOSIS — J029 Acute pharyngitis, unspecified: Secondary | ICD-10-CM

## 2012-02-03 DIAGNOSIS — D7289 Other specified disorders of white blood cells: Secondary | ICD-10-CM

## 2012-02-03 DIAGNOSIS — R51 Headache: Secondary | ICD-10-CM

## 2012-02-03 DIAGNOSIS — R519 Headache, unspecified: Secondary | ICD-10-CM

## 2012-02-03 LAB — CBC WITH DIFFERENTIAL/PLATELET
Basophils Relative: 0.4 % (ref 0.0–3.0)
Eosinophils Relative: 2 % (ref 0.0–5.0)
HCT: 38.4 % (ref 36.0–46.0)
Monocytes Relative: 8.7 % (ref 3.0–12.0)
Neutro Abs: 9.1 10*3/uL — ABNORMAL HIGH (ref 1.4–7.7)
Neutrophils Relative %: 74.5 % (ref 43.0–77.0)
Platelets: 236 10*3/uL (ref 150.0–400.0)
RBC: 4.16 Mil/uL (ref 3.87–5.11)
WBC: 12.3 10*3/uL — ABNORMAL HIGH (ref 4.5–10.5)

## 2012-02-03 MED ORDER — TRAMADOL HCL 50 MG PO TABS: 50.0000 mg | ORAL_TABLET | Freq: Four times a day (QID) | ORAL | Status: DC | PRN

## 2012-02-03 MED ORDER — CEPHALEXIN 500 MG PO CAPS: 500.0000 mg | ORAL_CAPSULE | Freq: Two times a day (BID) | ORAL | Status: DC

## 2012-02-03 NOTE — Patient Instructions (Addendum)
Zicam Melts or Zinc lozenges ; vitamin C 2000 mg daily; & Echinacea for 4-7 days. Report fever, exudate("pus") or progressive pain.  NSAIDS ( Aleve, Advil, Naproxen) or Tylenol every 4 hrs as needed for fever as discussed based on label recommendations . Stay well hydrated. Drink to thirst up to 40 ounces of fluids daily.

## 2012-02-03 NOTE — Addendum Note (Signed)
Addended byPecola Lawless on: 02/03/2012 05:47 PM   Modules accepted: Orders

## 2012-02-03 NOTE — Progress Notes (Signed)
  Subjective:    Patient ID: Samantha Gould, female    DOB: 05-21-1985, 27 y.o.   MRN: [id number]  HPI The respiratory tract symptoms began  01/31/12  as  Rhinitis. Sore throat as of 1/11 ; aching that night with temp to 100.Swimmy headed as of 1/12.   Significant associated symptoms include bitemporal  headache,  dental pain, & persistent sore throat Fever with chills and sweats  present   Beta strep neg in ER 1/12  Extrinsic symptoms of  sneezing  present .    Treatment with  NSAIDS, Alka Seltzer Pluswas partially effective  There is no history of asthma. The patient is still smoking 2-3 cg/day                  Review of Systems Symptoms not present include frontal headache, facial pain,  nasal purulence, earache , and otic discharge No cough or associated sputum , shortness of breath or wheezing Itchy , watery eyes  were not noted.  She denies diffuse arthralgias except some discomfort  in legs from hips down.     Objective:   Physical Exam  General appearance:good health ;well nourished; no acute distress or increased work of breathing is present.  No  lymphadenopathy about the head, neck, or axilla noted.  Eyes: No conjunctival inflammation or lid edema is present. Ears:  External ear exam shows no significant lesions or deformities.  Otoscopic examination reveals clear canals, tympanic membranes are intact bilaterally without bulging, retraction, inflammation or discharge. Nose:  External nasal examination shows no deformity or inflammation. Nasal mucosa areerythematous without lesions or exudates. No septal dislocation or deviation.No obstruction to airflow.  Oral exam: Dental hygiene is good; lips and gums are healthy appearing.There is  oropharyngeal erythema ; no exudate noted.  Neck:  No deformities, thyromegaly, or masses. Some tenderness with palpation.   Supple with full range of motion without pain.  Heart:  Normal rate and regular rhythm. S1 and S2 normal  without gallop, murmur, click, rub or other extra sounds.  Lungs:Chest clear to auscultation; no wheezes, rhonchi,rales ,or rubs present.No increased work of breathing.   Extremities:  No cyanosis, edema, or clubbing  noted  Skin: Warm & dry .        Assessment & Plan:  #1 pharyngitis without exudate; beta strep negative 24 hrs after onset of ST  #2 bitemporal headache  Plan: Monospot; see Orders

## 2012-02-04 ENCOUNTER — Telehealth: Payer: Self-pay | Admitting: Internal Medicine

## 2012-02-04 NOTE — Telephone Encounter (Signed)
Per RN, no follow-up needed from office, patient given home care instructions

## 2012-02-04 NOTE — Telephone Encounter (Signed)
Patient Information:  Caller Name: Dula  Phone: [phone]  Patient: Samantha Gould  Gender: Female  DOB: Apr 03, 1985  Age: 27 Years  PCP: Willow Ora  Pregnant: No  Office Follow Up:  Does the office need to follow up with this patient?: No  Instructions For The Office: N/A  RN Note:  Patient verbalized understanding of care advice and call back parameters.  Symptoms  Reason For Call & Symptoms: Reports nasal congestion, runny nose, productive cough. Reports chest burns when coughing.  Reviewed Health History In EMR: Yes  Reviewed Medications In EMR: Yes  Reviewed Allergies In EMR: Yes  Reviewed Surgeries / Procedures: Yes  Date of Onset of Symptoms: 01/31/2012  Treatments Tried: Vicks, Nyquil, Sinus Relief, Mucinex, Cold and Flu relief; Green tea with lemon juice and honey added.  Treatments Tried Worked: No OB / GYN:  LMP: 01/08/2012  Guideline(s) Used:  Colds  Disposition Per Guideline:   Home Care  Reason For Disposition Reached:   Colds with no complications  Advice Given:  Reassurance  Colds are very common and may make you feel uncomfortable.  Colds are caused by viruses, and no medicine or "shot" will cure an uncomplicated cold.  Colds are usually not serious.  For a Runny Nose With Profuse Discharge:   Nasal mucus and discharge helps to wash viruses and bacteria out of the nose and sinuses.  Blowing the nose is all that is needed.  If the skin around your nostrils gets irritated, apply a tiny amount of petroleum ointment to the nasal openings once or twice a day.  For a Stuffy Nose - Use Nasal Washes:  Introduction: Saline (salt water) nasal irrigation (nasal wash) is an effective and simple home remedy for treating stuffy nose and sinus congestion. The nose can be irrigated by pouring, spraying, or squirting salt water into the nose and then letting it run back out.  Step-By-Step Instructions:   Step 1: Lean over a sink.  Step 2: Gently squirt or spray  warm salt water into one of your nostrils.  Step 3: Some of the water may run into the back of your throat. Spit this out. If you swallow the salt water it will not hurt you.  Step 4: Blow your nose to clean out the water and mucus.  Step 5: Repeat steps 1-4 for the other nostril. You can do this a couple times a day if it seems to help you.  Treatment for Associated Symptoms of Colds:  For muscle aches, headaches, or moderate fever (more than 101 F or 38.9 C): Take acetaminophen every 4 hours.  Cough: Use cough drops.  Hydrate: Drink adequate liquids.  Humidifier:  If the air in your home is dry, use a cool-mist humidifier  Expected Course:   Fever may last 2-3 days  Nasal discharge 7-14 days  Cough up to 2-3 weeks.  Call Back If:  Difficulty breathing occurs  Nasal discharge lasts more than 10 days  Cough lasts more than 3 weeks  You become worse

## 2012-03-07 ENCOUNTER — Other Ambulatory Visit: Payer: Self-pay

## 2012-05-20 ENCOUNTER — Ambulatory Visit (INDEPENDENT_AMBULATORY_CARE_PROVIDER_SITE_OTHER): Payer: BC Managed Care – PPO | Admitting: *Deleted

## 2012-05-20 ENCOUNTER — Ambulatory Visit: Payer: BC Managed Care – PPO

## 2012-05-20 DIAGNOSIS — IMO0001 Reserved for inherently not codable concepts without codable children: Secondary | ICD-10-CM

## 2012-05-20 DIAGNOSIS — Z309 Encounter for contraceptive management, unspecified: Secondary | ICD-10-CM

## 2012-05-20 MED ORDER — MEDROXYPROGESTERONE ACETATE 150 MG/ML IM SUSP
150.0000 mg | Freq: Once | INTRAMUSCULAR | Status: AC
  Administered 2012-05-20: 150 mg via INTRAMUSCULAR

## 2012-08-06 ENCOUNTER — Ambulatory Visit (INDEPENDENT_AMBULATORY_CARE_PROVIDER_SITE_OTHER): Payer: BC Managed Care – PPO

## 2012-08-06 DIAGNOSIS — Z309 Encounter for contraceptive management, unspecified: Secondary | ICD-10-CM

## 2012-08-06 MED ORDER — MEDROXYPROGESTERONE ACETATE 150 MG/ML IM SUSP
150.0000 mg | Freq: Once | INTRAMUSCULAR | Status: AC
  Administered 2012-08-06: 150 mg via INTRAMUSCULAR

## 2012-11-12 ENCOUNTER — Ambulatory Visit: Payer: BC Managed Care – PPO

## 2012-11-13 ENCOUNTER — Ambulatory Visit (INDEPENDENT_AMBULATORY_CARE_PROVIDER_SITE_OTHER): Payer: BC Managed Care – PPO

## 2012-11-13 ENCOUNTER — Ambulatory Visit: Payer: BC Managed Care – PPO

## 2012-11-13 DIAGNOSIS — Z309 Encounter for contraceptive management, unspecified: Secondary | ICD-10-CM

## 2012-11-13 MED ORDER — MEDROXYPROGESTERONE ACETATE 150 MG/ML IM SUSP
150.0000 mg | Freq: Once | INTRAMUSCULAR | Status: AC
  Administered 2012-11-13: 150 mg via INTRAMUSCULAR

## 2012-11-26 ENCOUNTER — Other Ambulatory Visit: Payer: Self-pay

## 2013-02-04 ENCOUNTER — Ambulatory Visit (INDEPENDENT_AMBULATORY_CARE_PROVIDER_SITE_OTHER): Payer: BC Managed Care – PPO | Admitting: *Deleted

## 2013-02-04 DIAGNOSIS — Z309 Encounter for contraceptive management, unspecified: Secondary | ICD-10-CM

## 2013-02-04 MED ORDER — MEDROXYPROGESTERONE ACETATE 150 MG/ML IM SUSP
150.0000 mg | Freq: Once | INTRAMUSCULAR | Status: AC
  Administered 2013-02-04: 150 mg via INTRAMUSCULAR

## 2013-04-28 ENCOUNTER — Ambulatory Visit (INDEPENDENT_AMBULATORY_CARE_PROVIDER_SITE_OTHER): Payer: BC Managed Care – PPO | Admitting: *Deleted

## 2013-04-28 DIAGNOSIS — IMO0001 Reserved for inherently not codable concepts without codable children: Secondary | ICD-10-CM

## 2013-04-28 DIAGNOSIS — Z309 Encounter for contraceptive management, unspecified: Secondary | ICD-10-CM

## 2013-04-28 MED ORDER — MEDROXYPROGESTERONE ACETATE 150 MG/ML IM SUSP
150.0000 mg | Freq: Once | INTRAMUSCULAR | Status: AC
  Administered 2013-04-28: 150 mg via INTRAMUSCULAR

## 2013-07-21 ENCOUNTER — Ambulatory Visit: Payer: BC Managed Care – PPO

## 2013-07-26 ENCOUNTER — Ambulatory Visit: Payer: BC Managed Care – PPO

## 2013-07-27 ENCOUNTER — Ambulatory Visit (INDEPENDENT_AMBULATORY_CARE_PROVIDER_SITE_OTHER): Payer: BC Managed Care – PPO

## 2013-07-27 DIAGNOSIS — Z308 Encounter for other contraceptive management: Secondary | ICD-10-CM

## 2013-07-27 MED ORDER — MEDROXYPROGESTERONE ACETATE 150 MG/ML IM SUSP
150.0000 mg | Freq: Once | INTRAMUSCULAR | Status: AC
  Administered 2013-07-27: 150 mg via INTRAMUSCULAR

## 2013-10-26 ENCOUNTER — Telehealth: Payer: Self-pay | Admitting: Internal Medicine

## 2013-10-26 ENCOUNTER — Ambulatory Visit (INDEPENDENT_AMBULATORY_CARE_PROVIDER_SITE_OTHER): Payer: BC Managed Care – PPO

## 2013-10-26 DIAGNOSIS — Z3042 Encounter for surveillance of injectable contraceptive: Secondary | ICD-10-CM

## 2013-10-26 DIAGNOSIS — Z3049 Encounter for surveillance of other contraceptives: Secondary | ICD-10-CM

## 2013-10-26 MED ORDER — MEDROXYPROGESTERONE ACETATE 150 MG/ML IM SUSP
150.0000 mg | Freq: Once | INTRAMUSCULAR | Status: AC
  Administered 2013-10-26: 150 mg via INTRAMUSCULAR

## 2013-10-26 NOTE — Telephone Encounter (Signed)
Appointment scheduled for pt. °

## 2013-10-26 NOTE — Telephone Encounter (Signed)
Pt is wanting to know if dr. Drue NovelPaz is ok with her dr. Laury Axonlowne doing her cpe/pap, states she feels more comfortable with a female having her PAP done. Pt has to get her depo between 12/22-1/5 and wants to get her cpe/pap done the same day she makes that appt if possible. Pt understands that it has to be available slots for a CPE during that time frame.

## 2013-10-26 NOTE — Telephone Encounter (Signed)
Yes please , arrange a visit w/ Dr Laury AxonLowne at her convenience, ok to make Dr L her PCP

## 2013-10-26 NOTE — Telephone Encounter (Signed)
Please schedule CPE with Pap. We have plenty of openings.      KP

## 2013-10-26 NOTE — Telephone Encounter (Signed)
Please advise 

## 2014-01-18 ENCOUNTER — Telehealth: Payer: Self-pay | Admitting: *Deleted

## 2014-01-18 ENCOUNTER — Encounter: Payer: BC Managed Care – PPO | Admitting: Family Medicine

## 2014-01-18 NOTE — Telephone Encounter (Signed)
Pt did not show for appointment 01/18/2014 at 10:30am for "cpe/pap/depo" with [facility]. Pt called 01/18/2014 at 8:31am to reschedule "cpe/pap/depo" with [facility] 01/24/2014.

## 2014-01-24 ENCOUNTER — Encounter: Payer: Self-pay | Admitting: Family Medicine

## 2014-01-24 ENCOUNTER — Other Ambulatory Visit (HOSPITAL_COMMUNITY)
Admission: RE | Admit: 2014-01-24 | Discharge: 2014-01-24 | Disposition: A | Discharge status: Home / Self Care | Payer: BLUE CROSS/BLUE SHIELD | Source: Ambulatory Visit | Attending: Family Medicine | Admitting: Family Medicine

## 2014-01-24 ENCOUNTER — Ambulatory Visit (INDEPENDENT_AMBULATORY_CARE_PROVIDER_SITE_OTHER): Payer: BC Managed Care – PPO | Admitting: Family Medicine

## 2014-01-24 VITALS — BP 120/83 | HR 80 | Temp 98.1°F | Resp 16 | Ht 66.0 in | Wt 168.0 lb

## 2014-01-24 DIAGNOSIS — Z0000 Encounter for general adult medical examination without abnormal findings: Secondary | ICD-10-CM

## 2014-01-24 DIAGNOSIS — Z124 Encounter for screening for malignant neoplasm of cervix: Secondary | ICD-10-CM

## 2014-01-24 DIAGNOSIS — Z01419 Encounter for gynecological examination (general) (routine) without abnormal findings: Secondary | ICD-10-CM | POA: Insufficient documentation

## 2014-01-24 DIAGNOSIS — Z1151 Encounter for screening for human papillomavirus (HPV): Secondary | ICD-10-CM | POA: Diagnosis present

## 2014-01-24 DIAGNOSIS — Z308 Encounter for other contraceptive management: Secondary | ICD-10-CM

## 2014-01-24 DIAGNOSIS — Z23 Encounter for immunization: Secondary | ICD-10-CM

## 2014-01-24 LAB — CBC WITH DIFFERENTIAL/PLATELET
BASOS ABS: 0.1 10*3/uL (ref 0.0–0.1)
Basophils Relative: 0.6 % (ref 0.0–3.0)
Eosinophils Absolute: 0.2 10*3/uL (ref 0.0–0.7)
Eosinophils Relative: 1.8 % (ref 0.0–5.0)
HCT: 42.9 % (ref 36.0–46.0)
HEMOGLOBIN: 14.2 g/dL (ref 12.0–15.0)
LYMPHS PCT: 24.8 % (ref 12.0–46.0)
Lymphs Abs: 2.3 10*3/uL (ref 0.7–4.0)
MCHC: 33 g/dL (ref 30.0–36.0)
MCV: 93.1 fl (ref 78.0–100.0)
MONO ABS: 0.5 10*3/uL (ref 0.1–1.0)
Monocytes Relative: 5.5 % (ref 3.0–12.0)
NEUTROS PCT: 67.3 % (ref 43.0–77.0)
Neutro Abs: 6.1 10*3/uL (ref 1.4–7.7)
Platelets: 271 10*3/uL (ref 150.0–400.0)
RBC: 4.61 Mil/uL (ref 3.87–5.11)
RDW: 13.3 % (ref 11.5–15.5)
WBC: 9.1 10*3/uL (ref 4.0–10.5)

## 2014-01-24 LAB — LIPID PANEL
CHOLESTEROL: 198 mg/dL (ref 0–200)
HDL: 46 mg/dL (ref >39.00)
LDL Cholesterol: 138 mg/dL — ABNORMAL HIGH (ref 0–99)
NONHDL: 152
Total CHOL/HDL Ratio: 4
Triglycerides: 72 mg/dL (ref 0.0–149.0)
VLDL: 14.4 mg/dL (ref 0.0–40.0)

## 2014-01-24 LAB — HEPATIC FUNCTION PANEL
ALT: 15 U/L (ref 0–35)
AST: 22 U/L (ref 0–37)
Albumin: 4.1 g/dL (ref 3.5–5.2)
Alkaline Phosphatase: 73 U/L (ref 39–117)
BILIRUBIN DIRECT: 0.1 mg/dL (ref 0.0–0.3)
TOTAL PROTEIN: 7.3 g/dL (ref 6.0–8.3)
Total Bilirubin: 1.3 mg/dL — ABNORMAL HIGH (ref 0.2–1.2)

## 2014-01-24 LAB — BASIC METABOLIC PANEL
BUN: 7 mg/dL (ref 6–23)
CHLORIDE: 110 meq/L (ref 96–112)
CO2: 22 meq/L (ref 19–32)
CREATININE: 0.7 mg/dL (ref 0.4–1.2)
Calcium: 8.9 mg/dL (ref 8.4–10.5)
GFR: 119.11 mL/min (ref >60.00)
Glucose, Bld: 78 mg/dL (ref 70–99)
POTASSIUM: 3.7 meq/L (ref 3.5–5.1)
SODIUM: 142 meq/L (ref 135–145)

## 2014-01-24 LAB — TSH: TSH: 0.63 u[IU]/mL (ref 0.35–4.50)

## 2014-01-24 MED ORDER — MEDROXYPROGESTERONE ACETATE 150 MG/ML IM SUSP
150.0000 mg | Freq: Once | INTRAMUSCULAR | Status: AC
  Administered 2014-01-24: 150 mg via INTRAMUSCULAR

## 2014-01-24 NOTE — Progress Notes (Signed)
Pre visit review using our clinic review tool, if applicable. No additional management support is needed unless otherwise documented below in the visit note. 

## 2014-01-24 NOTE — Progress Notes (Signed)
Subjective:     Samantha Gould is a 29 y.o. female and is here for a comprehensive physical exam. The patient reports no problems.  History   Social History  . Marital Status: Single    Spouse Name: N/A    Number of Children: N/A  . Years of Education: N/A   Occupational History  . Not on file.   Social History Main Topics  . Smoking status: Former Smoker    Quit date: 08/24/2013  . Smokeless tobacco: Not on file     Comment: 2-3 cig/ day on average  . Alcohol Use: No  . Drug Use: No  . Sexual Activity: Not Currently   Other Topics Concern  . Not on file   Social History Narrative   Health Maintenance  Topic Date Due  . PAP SMEAR  10/20/2002  . TETANUS/TDAP  10/20/2003  . INFLUENZA VACCINE  10/06/2014 (Originally 08/21/2013)    The following portions of the patient's history were reviewed and updated as appropriate:  She  has a past medical history of Migraine. She  does not have any pertinent problems on file. She  has past surgical history that includes No past surgeries. Her family history includes Breast cancer in her maternal grandmother; Diabetes in an other family member; Migraines in her sister; Sleep apnea in her father and maternal grandfather. She  reports that she quit smoking about 5 months ago. She does not have any smokeless tobacco history on file. She reports that she does not drink alcohol or use illicit drugs. She has a current medication list which includes the following prescription(s): OVER THE COUNTER MEDICATION. Current Outpatient Prescriptions on File Prior to Visit  Medication Sig Dispense Refill  . OVER THE COUNTER MEDICATION Take 1 tablet by mouth 2 times daily at 12 noon and 4 pm. Generic Sinus pressure medication     No current facility-administered medications on file prior to visit.   She has No Known Allergies..  Review of Systems Review of Systems - Review of Systems  Constitutional: Negative for activity change, appetite change  and fatigue.  HENT: Negative for hearing loss, congestion, tinnitus and ear discharge.  dentist-- due Eyes: Negative for visual disturbance (see optho q2y -- vision corrected to 20/20 with glasses).  Respiratory: Negative for cough, chest tightness and shortness of breath.   Cardiovascular: Negative for chest pain, palpitations and leg swelling.  Gastrointestinal: Negative for abdominal pain, diarrhea, constipation and abdominal distention.  Genitourinary: Negative for urgency, frequency, decreased urine volume and difficulty urinating.  Musculoskeletal: Negative for back pain, arthralgias and gait problem.  Skin: Negative for color change, pallor and rash.  Neurological: Negative for dizziness, light-headedness, numbness and headaches.  Hematological: Negative for adenopathy. Does not bruise/bleed easily.  Psychiatric/Behavioral: Negative for suicidal ideas, confusion, sleep disturbance, self-injury, dysphoric mood, decreased concentration and agitation.       Objective:    BP 120/83 mmHg  Pulse 80  Temp(Src) 98.1 F (36.7 C) (Oral)  Resp 16  Ht  (1.676 m)  Wt 168 lb (76.204 kg)  BMI 27.13 kg/m2  SpO2 97% General appearance: alert, cooperative, appears stated age and no distress Head: Normocephalic, without obvious abnormality, atraumatic Eyes: conjunctivae/corneas clear. PERRL, EOM's intact. Fundi benign. Ears: normal TM's and external ear canals both ears Nose: Nares normal. Septum midline. Mucosa normal. No drainage or sinus tenderness. Throat: lips, mucosa, and tongue normal; teeth and gums normal Neck: no adenopathy, no carotid bruit, no JVD, supple, symmetrical, trachea midline and thyroid  not enlarged, symmetric, no tenderness/mass/nodules Back: symmetric, no curvature. ROM normal. No CVA tenderness. Lungs: clear to auscultation bilaterally Breasts: normal appearance, no masses or tenderness Heart: regular rate and rhythm, S1, S2 normal, no murmur, click, rub or  gallop Abdomen: soft, non-tender; bowel sounds normal; no masses,  no organomegaly Pelvic: cervix normal in appearance, external genitalia normal, no adnexal masses or tenderness, no cervical motion tenderness, rectovaginal septum normal, uterus normal size, shape, and consistency, vagina normal without discharge and pap done Extremities: extremities normal, atraumatic, no cyanosis or edema Pulses: 2+ and symmetric Skin: Skin color, texture, turgor normal. No rashes or lesions Lymph nodes: Cervical, supraclavicular, and axillary nodes normal. Neurologic: Alert and oriented X 3, normal strength and tone. Normal symmetric reflexes. Normal coordination and gait Psych-- no depression/ anxiety      Assessment:    Healthy female exam.    Plan:    ghm utd Check labs" : See After Visit Summary for Counseling Recommendations    1. Encounter for other contraceptive management   - medroxyPROGESTERone (DEPO-PROVERA) injection 150 mg; Inject 1 mL (150 mg total) into the muscle once.  2. Preventative health care   - Basic metabolic panel - CBC with Differential - Hepatic function panel - Lipid panel - POCT urinalysis dipstick - TSH  3. Screening for malignant neoplasm of cervix   - Cytology - PAP  4. Need for diphtheria-tetanus-pertussis (Tdap) vaccine, adult/adolescent   - Tdap vaccine greater than or equal to 7yo IM

## 2014-01-24 NOTE — Patient Instructions (Signed)
Preventive Care for Adults A healthy lifestyle and preventive care can promote health and wellness. Preventive health guidelines for women include the following key practices.  A routine yearly physical is a good way to check with your health care provider about your health and preventive screening. It is a chance to share any concerns and updates on your health and to receive a thorough exam.  Visit your dentist for a routine exam and preventive care every 6 months. Brush your teeth twice a day and floss once a day. Good oral hygiene prevents tooth decay and gum disease.  The frequency of eye exams is based on your age, health, family medical history, use of contact lenses, and other factors. Follow your health care provider's recommendations for frequency of eye exams.  Eat a healthy diet. Foods like vegetables, fruits, whole grains, low-fat dairy products, and lean protein foods contain the nutrients you need without too many calories. Decrease your intake of foods high in solid fats, added sugars, and salt. Eat the right amount of calories for you.Get information about a proper diet from your health care provider, if necessary.  Regular physical exercise is one of the most important things you can do for your health. Most adults should get at least 150 minutes of moderate-intensity exercise (any activity that increases your heart rate and causes you to sweat) each week. In addition, most adults need muscle-strengthening exercises on 2 or more days a week.  Maintain a healthy weight. The body mass index (BMI) is a screening tool to identify possible weight problems. It provides an estimate of body fat based on height and weight. Your health care provider can find your BMI and can help you achieve or maintain a healthy weight.For adults 20 years and older:  A BMI below 18.5 is considered underweight.  A BMI of 18.5 to 24.9 is normal.  A BMI of 25 to 29.9 is considered overweight.  A BMI of  30 and above is considered obese.  Maintain normal blood lipids and cholesterol levels by exercising and minimizing your intake of saturated fat. Eat a balanced diet with plenty of fruit and vegetables. Blood tests for lipids and cholesterol should begin at age 76 and be repeated every 5 years. If your lipid or cholesterol levels are high, you are over 50, or you are at high risk for heart disease, you may need your cholesterol levels checked more frequently.Ongoing high lipid and cholesterol levels should be treated with medicines if diet and exercise are not working.  If you smoke, find out from your health care provider how to quit. If you do not use tobacco, do not start.  Lung cancer screening is recommended for adults aged 22-80 years who are at high risk for developing lung cancer because of a history of smoking. A yearly low-dose CT scan of the lungs is recommended for people who have at least a 30-pack-year history of smoking and are a current smoker or have quit within the past 15 years. A pack year of smoking is smoking an average of 1 pack of cigarettes a day for 1 year (for example: 1 pack a day for 30 years or 2 packs a day for 15 years). Yearly screening should continue until the smoker has stopped smoking for at least 15 years. Yearly screening should be stopped for people who develop a health problem that would prevent them from having lung cancer treatment.  If you are pregnant, do not drink alcohol. If you are breastfeeding,  be very cautious about drinking alcohol. If you are not pregnant and choose to drink alcohol, do not have more than 1 drink per day. One drink is considered to be 12 ounces (355 mL) of beer, 5 ounces (148 mL) of wine, or 1.5 ounces (44 mL) of liquor.  Avoid use of street drugs. Do not share needles with anyone. Ask for help if you need support or instructions about stopping the use of drugs.  High blood pressure causes heart disease and increases the risk of  stroke. Your blood pressure should be checked at least every 1 to 2 years. Ongoing high blood pressure should be treated with medicines if weight loss and exercise do not work.  If you are 75-52 years old, ask your health care provider if you should take aspirin to prevent strokes.  Diabetes screening involves taking a blood sample to check your fasting blood sugar level. This should be done once every 3 years, after age 15, if you are within normal weight and without risk factors for diabetes. Testing should be considered at a younger age or be carried out more frequently if you are overweight and have at least 1 risk factor for diabetes.  Breast cancer screening is essential preventive care for women. You should practice "breast self-awareness." This means understanding the normal appearance and feel of your breasts and may include breast self-examination. Any changes detected, no matter how small, should be reported to a health care provider. Women in their 58s and 30s should have a clinical breast exam (CBE) by a health care provider as part of a regular health exam every 1 to 3 years. After age 16, women should have a CBE every year. Starting at age 53, women should consider having a mammogram (breast X-ray test) every year. Women who have a family history of breast cancer should talk to their health care provider about genetic screening. Women at a high risk of breast cancer should talk to their health care providers about having an MRI and a mammogram every year.  Breast cancer gene (BRCA)-related cancer risk assessment is recommended for women who have family members with BRCA-related cancers. BRCA-related cancers include breast, ovarian, tubal, and peritoneal cancers. Having family members with these cancers may be associated with an increased risk for harmful changes (mutations) in the breast cancer genes BRCA1 and BRCA2. Results of the assessment will determine the need for genetic counseling and  BRCA1 and BRCA2 testing.  Routine pelvic exams to screen for cancer are no longer recommended for nonpregnant women who are considered low risk for cancer of the pelvic organs (ovaries, uterus, and vagina) and who do not have symptoms. Ask your health care provider if a screening pelvic exam is right for you.  If you have had past treatment for cervical cancer or a condition that could lead to cancer, you need Pap tests and screening for cancer for at least 20 years after your treatment. If Pap tests have been discontinued, your risk factors (such as having a new sexual partner) need to be reassessed to determine if screening should be resumed. Some women have medical problems that increase the chance of getting cervical cancer. In these cases, your health care provider may recommend more frequent screening and Pap tests.  The HPV test is an additional test that may be used for cervical cancer screening. The HPV test looks for the virus that can cause the cell changes on the cervix. The cells collected during the Pap test can be  tested for HPV. The HPV test could be used to screen women aged 30 years and older, and should be used in women of any age who have unclear Pap test results. After the age of 30, women should have HPV testing at the same frequency as a Pap test.  Colorectal cancer can be detected and often prevented. Most routine colorectal cancer screening begins at the age of 50 years and continues through age 75 years. However, your health care provider may recommend screening at an earlier age if you have risk factors for colon cancer. On a yearly basis, your health care provider may provide home test kits to check for hidden blood in the stool. Use of a small camera at the end of a tube, to directly examine the colon (sigmoidoscopy or colonoscopy), can detect the earliest forms of colorectal cancer. Talk to your health care provider about this at age 50, when routine screening begins. Direct  exam of the colon should be repeated every 5-10 years through age 75 years, unless early forms of pre-cancerous polyps or small growths are found.  People who are at an increased risk for hepatitis B should be screened for this virus. You are considered at high risk for hepatitis B if:  You were born in a country where hepatitis B occurs often. Talk with your health care provider about which countries are considered high risk.  Your parents were born in a high-risk country and you have not received a shot to protect against hepatitis B (hepatitis B vaccine).  You have HIV or AIDS.  You use needles to inject street drugs.  You live with, or have sex with, someone who has hepatitis B.  You get hemodialysis treatment.  You take certain medicines for conditions like cancer, organ transplantation, and autoimmune conditions.  Hepatitis C blood testing is recommended for all people born from 1945 through 1965 and any individual with known risks for hepatitis C.  Practice safe sex. Use condoms and avoid high-risk sexual practices to reduce the spread of sexually transmitted infections (STIs). STIs include gonorrhea, chlamydia, syphilis, trichomonas, herpes, HPV, and human immunodeficiency virus (HIV). Herpes, HIV, and HPV are viral illnesses that have no cure. They can result in disability, cancer, and death.  You should be screened for sexually transmitted illnesses (STIs) including gonorrhea and chlamydia if:  You are sexually active and are younger than 24 years.  You are older than 24 years and your health care provider tells you that you are at risk for this type of infection.  Your sexual activity has changed since you were last screened and you are at an increased risk for chlamydia or gonorrhea. Ask your health care provider if you are at risk.  If you are at risk of being infected with HIV, it is recommended that you take a prescription medicine daily to prevent HIV infection. This is  called preexposure prophylaxis (PrEP). You are considered at risk if:  You are a heterosexual woman, are sexually active, and are at increased risk for HIV infection.  You take drugs by injection.  You are sexually active with a partner who has HIV.  Talk with your health care provider about whether you are at high risk of being infected with HIV. If you choose to begin PrEP, you should first be tested for HIV. You should then be tested every 3 months for as long as you are taking PrEP.  Osteoporosis is a disease in which the bones lose minerals and strength   with aging. This can result in serious bone fractures or breaks. The risk of osteoporosis can be identified using a bone density scan. Women ages 65 years and over and women at risk for fractures or osteoporosis should discuss screening with their health care providers. Ask your health care provider whether you should take a calcium supplement or vitamin D to reduce the rate of osteoporosis.  Menopause can be associated with physical symptoms and risks. Hormone replacement therapy is available to decrease symptoms and risks. You should talk to your health care provider about whether hormone replacement therapy is right for you.  Use sunscreen. Apply sunscreen liberally and repeatedly throughout the day. You should seek shade when your shadow is shorter than you. Protect yourself by wearing long sleeves, pants, a wide-brimmed hat, and sunglasses year round, whenever you are outdoors.  Once a month, do a whole body skin exam, using a mirror to look at the skin on your back. Tell your health care provider of new moles, moles that have irregular borders, moles that are larger than a pencil eraser, or moles that have changed in shape or color.  Stay current with required vaccines (immunizations).  Influenza vaccine. All adults should be immunized every year.  Tetanus, diphtheria, and acellular pertussis (Td, Tdap) vaccine. Pregnant women should  receive 1 dose of Tdap vaccine during each pregnancy. The dose should be obtained regardless of the length of time since the last dose. Immunization is preferred during the 27th-36th week of gestation. An adult who has not previously received Tdap or who does not know her vaccine status should receive 1 dose of Tdap. This initial dose should be followed by tetanus and diphtheria toxoids (Td) booster doses every 10 years. Adults with an unknown or incomplete history of completing a 3-dose immunization series with Td-containing vaccines should begin or complete a primary immunization series including a Tdap dose. Adults should receive a Td booster every 10 years.  Varicella vaccine. An adult without evidence of immunity to varicella should receive 2 doses or a second dose if she has previously received 1 dose. Pregnant females who do not have evidence of immunity should receive the first dose after pregnancy. This first dose should be obtained before leaving the health care facility. The second dose should be obtained 4-8 weeks after the first dose.  Human papillomavirus (HPV) vaccine. Females aged 13-26 years who have not received the vaccine previously should obtain the 3-dose series. The vaccine is not recommended for use in pregnant females. However, pregnancy testing is not needed before receiving a dose. If a female is found to be pregnant after receiving a dose, no treatment is needed. In that case, the remaining doses should be delayed until after the pregnancy. Immunization is recommended for any person with an immunocompromised condition through the age of 26 years if she did not get any or all doses earlier. During the 3-dose series, the second dose should be obtained 4-8 weeks after the first dose. The third dose should be obtained 24 weeks after the first dose and 16 weeks after the second dose.  Zoster vaccine. One dose is recommended for adults aged 60 years or older unless certain conditions are  present.  Measles, mumps, and rubella (MMR) vaccine. Adults born before 1957 generally are considered immune to measles and mumps. Adults born in 1957 or later should have 1 or more doses of MMR vaccine unless there is a contraindication to the vaccine or there is laboratory evidence of immunity to   each of the three diseases. A routine second dose of MMR vaccine should be obtained at least 28 days after the first dose for students attending postsecondary schools, health care workers, or international travelers. People who received inactivated measles vaccine or an unknown type of measles vaccine during 1963-1967 should receive 2 doses of MMR vaccine. People who received inactivated mumps vaccine or an unknown type of mumps vaccine before 1979 and are at high risk for mumps infection should consider immunization with 2 doses of MMR vaccine. For females of childbearing age, rubella immunity should be determined. If there is no evidence of immunity, females who are not pregnant should be vaccinated. If there is no evidence of immunity, females who are pregnant should delay immunization until after pregnancy. Unvaccinated health care workers born before 1957 who lack laboratory evidence of measles, mumps, or rubella immunity or laboratory confirmation of disease should consider measles and mumps immunization with 2 doses of MMR vaccine or rubella immunization with 1 dose of MMR vaccine.  Pneumococcal 13-valent conjugate (PCV13) vaccine. When indicated, a person who is uncertain of her immunization history and has no record of immunization should receive the PCV13 vaccine. An adult aged 19 years or older who has certain medical conditions and has not been previously immunized should receive 1 dose of PCV13 vaccine. This PCV13 should be followed with a dose of pneumococcal polysaccharide (PPSV23) vaccine. The PPSV23 vaccine dose should be obtained at least 8 weeks after the dose of PCV13 vaccine. An adult aged 19  years or older who has certain medical conditions and previously received 1 or more doses of PPSV23 vaccine should receive 1 dose of PCV13. The PCV13 vaccine dose should be obtained 1 or more years after the last PPSV23 vaccine dose.  Pneumococcal polysaccharide (PPSV23) vaccine. When PCV13 is also indicated, PCV13 should be obtained first. All adults aged 65 years and older should be immunized. An adult younger than age 65 years who has certain medical conditions should be immunized. Any person who resides in a nursing home or long-term care facility should be immunized. An adult smoker should be immunized. People with an immunocompromised condition and certain other conditions should receive both PCV13 and PPSV23 vaccines. People with human immunodeficiency virus (HIV) infection should be immunized as soon as possible after diagnosis. Immunization during chemotherapy or radiation therapy should be avoided. Routine use of PPSV23 vaccine is not recommended for American Indians, [place] Natives, or people younger than 65 years unless there are medical conditions that require PPSV23 vaccine. When indicated, people who have unknown immunization and have no record of immunization should receive PPSV23 vaccine. One-time revaccination 5 years after the first dose of PPSV23 is recommended for people aged 19-64 years who have chronic kidney failure, nephrotic syndrome, asplenia, or immunocompromised conditions. People who received 1-2 doses of PPSV23 before age 65 years should receive another dose of PPSV23 vaccine at age 65 years or later if at least 5 years have passed since the previous dose. Doses of PPSV23 are not needed for people immunized with PPSV23 at or after age 65 years.  Meningococcal vaccine. Adults with asplenia or persistent complement component deficiencies should receive 2 doses of quadrivalent meningococcal conjugate (MenACWY-D) vaccine. The doses should be obtained at least 2 months apart.  Microbiologists working with certain meningococcal bacteria, military recruits, people at risk during an outbreak, and people who travel to or live in countries with a high rate of meningitis should be immunized. A first-year college student up through age   21 years who is living in a residence hall should receive a dose if she did not receive a dose on or after her 16th birthday. Adults who have certain high-risk conditions should receive one or more doses of vaccine.  Hepatitis A vaccine. Adults who wish to be protected from this disease, have certain high-risk conditions, work with hepatitis A-infected animals, work in hepatitis A research labs, or travel to or work in countries with a high rate of hepatitis A should be immunized. Adults who were previously unvaccinated and who anticipate close contact with an international adoptee during the first 60 days after arrival in the [place] from a country with a high rate of hepatitis A should be immunized.  Hepatitis B vaccine. Adults who wish to be protected from this disease, have certain high-risk conditions, may be exposed to blood or other infectious body fluids, are household contacts or sex partners of hepatitis B positive people, are clients or workers in certain care facilities, or travel to or work in countries with a high rate of hepatitis B should be immunized.  Haemophilus influenzae type b (Hib) vaccine. A previously unvaccinated person with asplenia or sickle cell disease or having a scheduled splenectomy should receive 1 dose of Hib vaccine. Regardless of previous immunization, a recipient of a hematopoietic stem cell transplant should receive a 3-dose series 6-12 months after her successful transplant. Hib vaccine is not recommended for adults with HIV infection. Preventive Services / Frequency Ages 64 to 68 years  Blood pressure check.** / Every 1 to 2 years.  Lipid and cholesterol check.** / Every 5 years beginning at age  22.  Clinical breast exam.** / Every 3 years for women in their 88s and 53s.  BRCA-related cancer risk assessment.** / For women who have family members with a BRCA-related cancer (breast, ovarian, tubal, or peritoneal cancers).  Pap test.** / Every 2 years from ages 90 through 51. Every 3 years starting at age 21 through age 56 or 3 with a history of 3 consecutive normal Pap tests.  HPV screening.** / Every 3 years from ages 24 through ages 1 to 46 with a history of 3 consecutive normal Pap tests.  Hepatitis C blood test.** / For any individual with known risks for hepatitis C.  Skin self-exam. / Monthly.  Influenza vaccine. / Every year.  Tetanus, diphtheria, and acellular pertussis (Tdap, Td) vaccine.** / Consult your health care provider. Pregnant women should receive 1 dose of Tdap vaccine during each pregnancy. 1 dose of Td every 10 years.  Varicella vaccine.** / Consult your health care provider. Pregnant females who do not have evidence of immunity should receive the first dose after pregnancy.  HPV vaccine. / 3 doses over 6 months, if 72 and younger. The vaccine is not recommended for use in pregnant females. However, pregnancy testing is not needed before receiving a dose.  Measles, mumps, rubella (MMR) vaccine.** / You need at least 1 dose of MMR if you were born in 1957 or later. You may also need a 2nd dose. For females of childbearing age, rubella immunity should be determined. If there is no evidence of immunity, females who are not pregnant should be vaccinated. If there is no evidence of immunity, females who are pregnant should delay immunization until after pregnancy.  Pneumococcal 13-valent conjugate (PCV13) vaccine.** / Consult your health care provider.  Pneumococcal polysaccharide (PPSV23) vaccine.** / 1 to 2 doses if you smoke cigarettes or if you have certain conditions.  Meningococcal vaccine.** /  1 dose if you are age 19 to 21 years and a first-year college  student living in a residence hall, or have one of several medical conditions, you need to get vaccinated against meningococcal disease. You may also need additional booster doses.  Hepatitis A vaccine.** / Consult your health care provider.  Hepatitis B vaccine.** / Consult your health care provider.  Haemophilus influenzae type b (Hib) vaccine.** / Consult your health care provider. Ages 40 to 64 years  Blood pressure check.** / Every 1 to 2 years.  Lipid and cholesterol check.** / Every 5 years beginning at age 20 years.  Lung cancer screening. / Every year if you are aged 55-80 years and have a 30-pack-year history of smoking and currently smoke or have quit within the past 15 years. Yearly screening is stopped once you have quit smoking for at least 15 years or develop a health problem that would prevent you from having lung cancer treatment.  Clinical breast exam.** / Every year after age 40 years.  BRCA-related cancer risk assessment.** / For women who have family members with a BRCA-related cancer (breast, ovarian, tubal, or peritoneal cancers).  Mammogram.** / Every year beginning at age 40 years and continuing for as long as you are in good health. Consult with your health care provider.  Pap test.** / Every 3 years starting at age 30 years through age 65 or 70 years with a history of 3 consecutive normal Pap tests.  HPV screening.** / Every 3 years from ages 30 years through ages 65 to 70 years with a history of 3 consecutive normal Pap tests.  Fecal occult blood test (FOBT) of stool. / Every year beginning at age 50 years and continuing until age 75 years. You may not need to do this test if you get a colonoscopy every 10 years.  Flexible sigmoidoscopy or colonoscopy.** / Every 5 years for a flexible sigmoidoscopy or every 10 years for a colonoscopy beginning at age 50 years and continuing until age 75 years.  Hepatitis C blood test.** / For all people born from 1945 through  1965 and any individual with known risks for hepatitis C.  Skin self-exam. / Monthly.  Influenza vaccine. / Every year.  Tetanus, diphtheria, and acellular pertussis (Tdap/Td) vaccine.** / Consult your health care provider. Pregnant women should receive 1 dose of Tdap vaccine during each pregnancy. 1 dose of Td every 10 years.  Varicella vaccine.** / Consult your health care provider. Pregnant females who do not have evidence of immunity should receive the first dose after pregnancy.  Zoster vaccine.** / 1 dose for adults aged 60 years or older.  Measles, mumps, rubella (MMR) vaccine.** / You need at least 1 dose of MMR if you were born in 1957 or later. You may also need a 2nd dose. For females of childbearing age, rubella immunity should be determined. If there is no evidence of immunity, females who are not pregnant should be vaccinated. If there is no evidence of immunity, females who are pregnant should delay immunization until after pregnancy.  Pneumococcal 13-valent conjugate (PCV13) vaccine.** / Consult your health care provider.  Pneumococcal polysaccharide (PPSV23) vaccine.** / 1 to 2 doses if you smoke cigarettes or if you have certain conditions.  Meningococcal vaccine.** / Consult your health care provider.  Hepatitis A vaccine.** / Consult your health care provider.  Hepatitis B vaccine.** / Consult your health care provider.  Haemophilus influenzae type b (Hib) vaccine.** / Consult your health care provider. Ages 65   years and over  Blood pressure check.** / Every 1 to 2 years.  Lipid and cholesterol check.** / Every 5 years beginning at age 22 years.  Lung cancer screening. / Every year if you are aged 73-80 years and have a 30-pack-year history of smoking and currently smoke or have quit within the past 15 years. Yearly screening is stopped once you have quit smoking for at least 15 years or develop a health problem that would prevent you from having lung cancer  treatment.  Clinical breast exam.** / Every year after age 4 years.  BRCA-related cancer risk assessment.** / For women who have family members with a BRCA-related cancer (breast, ovarian, tubal, or peritoneal cancers).  Mammogram.** / Every year beginning at age 40 years and continuing for as long as you are in good health. Consult with your health care provider.  Pap test.** / Every 3 years starting at age 9 years through age 34 or 91 years with 3 consecutive normal Pap tests. Testing can be stopped between 65 and 70 years with 3 consecutive normal Pap tests and no abnormal Pap or HPV tests in the past 10 years.  HPV screening.** / Every 3 years from ages 57 years through ages 64 or 45 years with a history of 3 consecutive normal Pap tests. Testing can be stopped between 65 and 70 years with 3 consecutive normal Pap tests and no abnormal Pap or HPV tests in the past 10 years.  Fecal occult blood test (FOBT) of stool. / Every year beginning at age 15 years and continuing until age 17 years. You may not need to do this test if you get a colonoscopy every 10 years.  Flexible sigmoidoscopy or colonoscopy.** / Every 5 years for a flexible sigmoidoscopy or every 10 years for a colonoscopy beginning at age 86 years and continuing until age 71 years.  Hepatitis C blood test.** / For all people born from [birth year] through 1966 and any individual with known risks for hepatitis C.  Osteoporosis screening.** / A one-time screening for women ages 83 years and over and women at risk for fractures or osteoporosis.  Skin self-exam. / Monthly.  Influenza vaccine. / Every year.  Tetanus, diphtheria, and acellular pertussis (Tdap/Td) vaccine.** / 1 dose of Td every 10 years.  Varicella vaccine.** / Consult your health care provider.  Zoster vaccine.** / 1 dose for adults aged 61 years or older.  Pneumococcal 13-valent conjugate (PCV13) vaccine.** / Consult your health care provider.  Pneumococcal  polysaccharide (PPSV23) vaccine.** / 1 dose for all adults aged 28 years and older.  Meningococcal vaccine.** / Consult your health care provider.  Hepatitis A vaccine.** / Consult your health care provider.  Hepatitis B vaccine.** / Consult your health care provider.  Haemophilus influenzae type b (Hib) vaccine.** / Consult your health care provider. ** Family history and personal history of risk and conditions may change your health care provider's recommendations. Document Released: 03/05/2001 Document Revised: 05/24/2013 Document Reviewed: 06/04/2010 [facility] Patient Information 2015 [place], [place]. This information is not intended to replace advice given to you by your health care provider. Make sure you discuss any questions you have with your health care provider.

## 2014-01-26 ENCOUNTER — Encounter: Payer: Self-pay | Admitting: Family Medicine

## 2014-01-26 LAB — CYTOLOGY - PAP

## 2014-02-22 ENCOUNTER — Ambulatory Visit: Payer: BC Managed Care – PPO | Admitting: Family Medicine

## 2014-04-21 ENCOUNTER — Ambulatory Visit (INDEPENDENT_AMBULATORY_CARE_PROVIDER_SITE_OTHER): Payer: BLUE CROSS/BLUE SHIELD | Admitting: *Deleted

## 2014-04-21 DIAGNOSIS — Z308 Encounter for other contraceptive management: Secondary | ICD-10-CM

## 2014-04-21 MED ORDER — MEDROXYPROGESTERONE ACETATE 150 MG/ML IM SUSP
150.0000 mg | Freq: Once | INTRAMUSCULAR | Status: AC
  Administered 2014-04-21: 150 mg via INTRAMUSCULAR

## 2014-04-21 NOTE — Progress Notes (Signed)
Pre visit review using our clinic review tool, if applicable. No additional management support is needed unless otherwise documented below in the visit note.  Patient tolerated injection well.  Next injection scheduled for 07/07/14.   

## 2014-04-22 ENCOUNTER — Ambulatory Visit: Payer: BC Managed Care – PPO

## 2014-05-02 ENCOUNTER — Ambulatory Visit (INDEPENDENT_AMBULATORY_CARE_PROVIDER_SITE_OTHER): Payer: BLUE CROSS/BLUE SHIELD | Admitting: Medical

## 2014-05-02 ENCOUNTER — Encounter: Payer: Self-pay | Admitting: Medical

## 2014-05-02 VITALS — BP 120/78 | HR 99 | Temp 98.3°F | Ht 66.0 in | Wt 176.2 lb

## 2014-05-02 DIAGNOSIS — J309 Allergic rhinitis, unspecified: Secondary | ICD-10-CM | POA: Insufficient documentation

## 2014-05-02 DIAGNOSIS — J0100 Acute maxillary sinusitis, unspecified: Secondary | ICD-10-CM

## 2014-05-02 DIAGNOSIS — J301 Allergic rhinitis due to pollen: Secondary | ICD-10-CM

## 2014-05-02 MED ORDER — BENZONATATE 100 MG PO CAPS: 100.0000 mg | ORAL_CAPSULE | Freq: Three times a day (TID) | ORAL | Status: DC | PRN

## 2014-05-02 MED ORDER — AZITHROMYCIN 250 MG PO TABS: ORAL_TABLET | ORAL | Status: DC

## 2014-05-02 MED ORDER — MOMETASONE FUROATE 50 MCG/ACT NA SUSP: NASAL | Status: DC

## 2014-05-02 NOTE — Progress Notes (Signed)
Subjective:    Patient ID: Samantha Gould, female    DOB: [birth year], 29 y.o.   MRN: [id number]  HPI  Pt in with some recent nasal congestion and sinus pressure. Sneezing and some itching eyes. Some coughing spells. No wheezing.  Pt has allergies this time of year and in September.  Since trying mucinex and sudafed.   Review of Systems  Constitutional: Negative for fever, chills and fatigue.  HENT: Positive for congestion, postnasal drip, sinus pressure and sneezing. Negative for ear pain, mouth sores, nosebleeds and sore throat.   Respiratory: Positive for choking. Negative for cough, chest tightness, shortness of breath and wheezing.   Cardiovascular: Negative for chest pain and palpitations.  Gastrointestinal: Negative for abdominal pain.  Musculoskeletal: Negative for back pain.  Skin: Negative for pallor and rash.  Neurological: Negative for dizziness, speech difficulty, weakness, numbness and headaches.  Hematological: Negative for adenopathy. Does not bruise/bleed easily.   Past Medical History  Diagnosis Date  . Migraine     History   Social History  . Marital Status: Married    Spouse Name: N/A  . Number of Children: N/A  . Years of Education: N/A   Occupational History  . Not on file.   Social History Main Topics  . Smoking status: Former Smoker    Quit date: 08/24/2013  . Smokeless tobacco: Not on file     Comment: 2-3 cig/ day on average  . Alcohol Use: No  . Drug Use: No  . Sexual Activity: Not Currently   Other Topics Concern  . Not on file   Social History Narrative    Past Surgical History  Procedure Laterality Date  . No past surgeries      Family History  Problem Relation Age of Onset  . Migraines Sister   . Breast cancer Maternal Grandmother   . Diabetes Maternal Grandmother   . Sleep apnea Father   . Sleep apnea Maternal Grandfather   . Diabetes Mother     No Known Allergies  Current Outpatient Prescriptions on File Prior to  Visit  Medication Sig Dispense Refill  . OVER THE COUNTER MEDICATION Take 1 tablet by mouth 2 times daily at 12 noon and 4 pm. Generic Sinus pressure medication     No current facility-administered medications on file prior to visit.    BP 120/78 mmHg  Pulse 99  Temp(Src) 98.3 F (36.8 C) (Oral)  Ht 5\' 6"  (1.676 m)  Wt 176 lb 3.2 oz (79.924 kg)  BMI 28.45 kg/m2  SpO2 97%       Objective:   Physical Exam  General  Mental Status - Alert. General Appearance - Well groomed. Not in acute distress.  Skin Rashes- No Rashes.  HEENT Head- Normal. Ear Auditory Canal - Left- Normal. Right - Normal.Tympanic Membrane- Left- Normal. Right- Normal. Eye Sclera/Conjunctiva- Left- Normal. Right- Normal. Nose & Sinuses Nasal Mucosa- Left-  Boggy and Congested. Right-  Boggy and  Congested.faint maxillary and frontal sinus pressure. Nose- has allergic salute crease. Mouth & Throat Lips: Upper Lip- Normal: no dryness, cracking, pallor, cyanosis, or vesicular eruption. Lower Lip-Normal: no dryness, cracking, pallor, cyanosis or vesicular eruption. Buccal Mucosa- Bilateral- No Aphthous ulcers. Oropharynx- No Discharge or Erythema. +pnd. Tonsils: Characteristics- Bilateral- No Erythema or Congestion. Size/Enlargement- Bilateral- No enlargement. Discharge- bilateral-None.  Neck Neck- Supple. No Masses.   Chest and Lung Exam Auscultation: Breath Sounds:-Clear even and unlabored.  Cardiovascular Auscultation:Rythm- Regular, rate and rhythm. Murmurs & Other Heart Sounds:Ausculatation of the  heart reveal- No Murmurs.  Lymphatic Head & Neck General Head & Neck Lymphatics: Bilateral: Description- No Localized lymphadenopathy.       Assessment & Plan:

## 2014-05-02 NOTE — Assessment & Plan Note (Signed)
Recent allergy symptoms. Rx nasonex. If not covered then use flonase otc. Recommend starting over the counter antihistamine. Either allegra, zyrtec or Claritin.

## 2014-05-02 NOTE — Assessment & Plan Note (Signed)
If sinus pressure persists despite tx for allergies then start azithromycin.

## 2014-05-02 NOTE — Progress Notes (Signed)
Pre visit review using our clinic review tool, if applicable. No additional management support is needed unless otherwise documented below in the visit note. 

## 2014-05-02 NOTE — Patient Instructions (Addendum)
Allergic rhinitis Recent allergy symptoms. Rx nasonex. If not covered then use flonase otc. Recommend starting over the counter antihistamine. Either allegra, zyrtec or Claritin.       Sinusitis, acute maxillary If sinus pressure persists despite tx for allergies then start azithromycin.      Follow up in 7 days any persisting signs/symptoms  or as needed

## 2014-07-05 ENCOUNTER — Telehealth: Payer: Self-pay | Admitting: Internal Medicine

## 2014-07-05 NOTE — Telephone Encounter (Signed)
Last physical exam per Dr. Laury Axon, will defer to her

## 2014-07-05 NOTE — Telephone Encounter (Signed)
Will Pt need appt to switch to birth control pill?

## 2014-07-05 NOTE — Telephone Encounter (Signed)
LMOM informing Pt of Dr. Ernst Spell recommendations. Informed her to return call at her earliest convenience.

## 2014-07-05 NOTE — Telephone Encounter (Signed)
Pt has decided not to take birth control pills at this time. Pt informed she would like to discuss with her husband first. I informed her that is completely fine, she can call anytime if she decides to take the pills.

## 2014-07-05 NOTE — Telephone Encounter (Signed)
She can switch to pill

## 2014-07-05 NOTE — Telephone Encounter (Signed)
Caller name: Laquishia Gimlin Relationship to patient: self Can be reached: [phone] [facility] [address]  Reason for call: Pt has been getting Depo shot. She is wondering what she needs to do to change to a birth control pill daily. Does she need to get the Depo shot scheduled for 6/9 or can she cancel and start pill? Please notify pt.

## 2014-07-05 NOTE — Telephone Encounter (Signed)
Caller name: Sue-Ann Bradney  Relationship to patient: self  Can be reached: [phone]  Pt returning your call.

## 2014-07-05 NOTE — Telephone Encounter (Signed)
NO---- I would ask her if she has ever been on them in past and if so what worked --- if not trisprintec #1 as directed 11 refills and to call if any problems---

## 2014-07-05 NOTE — Telephone Encounter (Signed)
Please advise 

## 2014-07-07 ENCOUNTER — Ambulatory Visit: Payer: BLUE CROSS/BLUE SHIELD | Admitting: *Deleted

## 2014-07-07 DIAGNOSIS — Z3042 Encounter for surveillance of injectable contraceptive: Secondary | ICD-10-CM

## 2014-07-07 DIAGNOSIS — Z30011 Encounter for initial prescription of contraceptive pills: Secondary | ICD-10-CM

## 2014-07-07 MED ORDER — MEDROXYPROGESTERONE ACETATE 150 MG/ML IM SUSP: 150.0000 mg | Freq: Once | INTRAMUSCULAR | Status: DC

## 2014-07-07 MED ORDER — NORGESTIM-ETH ESTRAD TRIPHASIC 0.18/0.215/0.25 MG-35 MCG PO TABS: 1.0000 | ORAL_TABLET | Freq: Every day | ORAL | Status: DC

## 2014-07-07 NOTE — Progress Notes (Signed)
Pre visit review using our clinic review tool, if applicable. No additional management support is needed unless otherwise documented below in the visit note.  Patient decided to begin taking birth control pills- [facility] per phone note with Dr. Laury Axon.  Side effects and back-up protection discussed with patient.

## 2014-08-12 ENCOUNTER — Ambulatory Visit (HOSPITAL_BASED_OUTPATIENT_CLINIC_OR_DEPARTMENT_OTHER)
Admission: RE | Admit: 2014-08-12 | Discharge: 2014-08-12 | Disposition: A | Discharge status: Home / Self Care | Payer: BLUE CROSS/BLUE SHIELD | Source: Ambulatory Visit | Attending: Internal Medicine | Admitting: Internal Medicine

## 2014-08-12 ENCOUNTER — Encounter: Payer: Self-pay | Admitting: Internal Medicine

## 2014-08-12 ENCOUNTER — Ambulatory Visit (INDEPENDENT_AMBULATORY_CARE_PROVIDER_SITE_OTHER): Payer: BLUE CROSS/BLUE SHIELD | Admitting: Internal Medicine

## 2014-08-12 ENCOUNTER — Other Ambulatory Visit: Payer: Self-pay

## 2014-08-12 VITALS — BP 116/78 | HR 86 | Temp 98.2°F | Ht 66.0 in | Wt 187.4 lb

## 2014-08-12 DIAGNOSIS — R079 Chest pain, unspecified: Secondary | ICD-10-CM | POA: Diagnosis present

## 2014-08-12 DIAGNOSIS — G43009 Migraine without aura, not intractable, without status migrainosus: Secondary | ICD-10-CM

## 2014-08-12 DIAGNOSIS — R0602 Shortness of breath: Secondary | ICD-10-CM | POA: Insufficient documentation

## 2014-08-12 LAB — POCT URINE PREGNANCY: PREG TEST UR: NEGATIVE

## 2014-08-12 MED ORDER — SUMATRIPTAN SUCCINATE 100 MG PO TABS: 100.0000 mg | ORAL_TABLET | Freq: Every day | ORAL | Status: DC | PRN

## 2014-08-12 MED ORDER — KETOROLAC TROMETHAMINE 60 MG/2ML IM SOLN
60.0000 mg | Freq: Once | INTRAMUSCULAR | Status: AC
  Administered 2014-08-12: 60 mg via INTRAMUSCULAR

## 2014-08-12 MED ORDER — PREDNISONE 10 MG PO TABS: ORAL_TABLET | ORAL | Status: DC

## 2014-08-12 NOTE — Assessment & Plan Note (Signed)
Seems to be having a classic migraine. Plan: toradol today IM Prednisone Imitrex OTCs as needed She has been migraine free for a year, if symptoms recur, consider Topamax

## 2014-08-12 NOTE — Patient Instructions (Signed)
  Stop by the first floor and get the XR   For headaches: Rest, drink plenty of fluids Prednisone for a few days Imitrex 1 tablet a day, as needed for headache. May repeat Imitrex one tablet 2 hours after if the headaches continue, no more than 2 tablets in a 24-hoyr period Okay to take OTC Tylenol or Motrin  Call anytime if you have fever, chills, the headaches are severe or not improving  Follow-up with your doctor in 4 weeks.

## 2014-08-12 NOTE — Progress Notes (Signed)
Subjective:    Patient ID: Samantha Gould, female    DOB: 04-04-1985, 29 y.o.   MRN: [id number]  DOS:  08/12/2014 Type of visit - description :  Acute Interval history: History of migraine headaches,was asx for about a year, symptoms resurfaced 3 weeks ago: On and off headache, usually left-sided, increase w/ light but not with noise, a couple of times associated with nausea. This week, the headache is lasting 3 days already. Partial responding to OTCs. The headache itself is similar to previous migraines.  Also complained of chest pain, started early in June, about one episode a week. Described as sharp, stabbing, lasts 1 minute, and the left upper side of the chest, worse with deep breathings. Chest pain happening with using the elliptical machine but also at rest. No radiation ans self resolvsd.   Review of Systems  No fever, chills, sinus pain or congestion No cough, sputum production or wheezing No dizziness, diplopia, sore speech or motor deficits. No head injury No recent airplane trips, did have a car trip in July. Recently changed from depo shots to birth-control pills,. Started 4 days ago.  Past Medical History  Diagnosis Date  . Migraine     Past Surgical History  Procedure Laterality Date  . No past surgeries      History   Social History  . Marital Status: Married    Spouse Name: N/A  . Number of Children: N/A  . Years of Education: N/A   Occupational History  . Not on file.   Social History Main Topics  . Smoking status: Former Smoker    Quit date: 08/24/2013  . Smokeless tobacco: Not on file     Comment: 2-3 cig/ day on average  . Alcohol Use: No  . Drug Use: No  . Sexual Activity: Not Currently   Other Topics Concern  . Not on file   Social History Narrative        Medication List       This list is accurate as of: 08/12/14  8:36 PM.  Always use your most recent med list.               mometasone 50 MCG/ACT nasal spray  Commonly  known as:  NASONEX  2 sprays each nostril q day     Norgestimate-Ethinyl Estradiol Triphasic 0.18/0.215/0.25 MG-35 MCG tablet  Commonly known as:  TRI-SPRINTEC  Take 1 tablet by mouth daily.     OVER THE COUNTER MEDICATION  Take 1 tablet by mouth 2 times daily at 12 noon and 4 pm. Generic Sinus pressure medication     predniSONE 10 MG tablet  Commonly known as:  DELTASONE  4 tablets x 2 days, 3 tabs x 2 days, 2 tabs x 2 days, 1 tab x 2 days     SUMAtriptan 100 MG tablet  Commonly known as:  IMITREX  Take 1 tablet (100 mg total) by mouth daily as needed for migraine. May repeat a second tablet 2 hours after first if the headache  continue but No more than 2 tablets in a 24-hour period           Objective:   Physical Exam BP 116/78 mmHg  Pulse 86  Temp(Src) 98.2 F (36.8 C) (Oral)  Ht  (1.676 m)  Wt 187 lb 6 oz (84.993 kg)  BMI 30.26 kg/m2  SpO2 97%  LMP 08/12/2014 (Exact Date) General:   Well developed, well nourished . NAD, she is sitting in the  examining table with a light off.  HEENT:  Normocephalic . Face symmetric, atraumatic. Neck is full range of motion. Lungs:  CTA B Normal respiratory effort, no intercostal retractions, no accessory muscle use. Heart: RRR,  no murmur.  No pretibial edema bilaterally . Calves symmetric Skin: Not pale. Not jaundice Neurologic:  alert & oriented X3.  Speech normal, gait appropriate for age and unassisted EOMI, pupils equal and reactive DTRs symmetric Psych--  Cognition and judgment appear intact.  Cooperative with normal attention span and concentration.  Behavior appropriate. No anxious or depressed appearing.      Assessment & Plan:   Chest pain, On and off chest pain, self resolved within a minute, sharp. No recent factors for PE except for BCP (pain  started before prolonged car trip) EKG sinus rhythm Get a chest x-ray Observation for now Follow-up 4 weeks

## 2014-08-12 NOTE — Progress Notes (Signed)
Pre visit review using our clinic review tool, if applicable. No additional management support is needed unless otherwise documented below in the visit note. 

## 2014-08-18 ENCOUNTER — Telehealth: Payer: Self-pay | Admitting: Internal Medicine

## 2014-08-18 NOTE — Telephone Encounter (Signed)
Pt called stating she took dose of birth control at wrong time. She is asking for call back to see if that is ok or if it will cause a problem. Please call [phone].

## 2014-08-18 NOTE — Telephone Encounter (Signed)
Clarified with patient who stated she took the wrong pill at the right time.  She took week 3 day 2 instead of week 2 day 2 (took a pill that was due for next week).  Per Esperanza Richters, PA patient should keep taking as scheduled.  Notified patient and she stated understanding.

## 2014-09-09 ENCOUNTER — Encounter: Payer: Self-pay | Admitting: Internal Medicine

## 2014-09-09 ENCOUNTER — Ambulatory Visit (INDEPENDENT_AMBULATORY_CARE_PROVIDER_SITE_OTHER): Payer: BLUE CROSS/BLUE SHIELD | Admitting: Internal Medicine

## 2014-09-09 VITALS — BP 126/78 | HR 111 | Temp 98.3°F | Ht 66.0 in | Wt 193.2 lb

## 2014-09-09 DIAGNOSIS — G43009 Migraine without aura, not intractable, without status migrainosus: Secondary | ICD-10-CM | POA: Diagnosis not present

## 2014-09-09 DIAGNOSIS — R079 Chest pain, unspecified: Secondary | ICD-10-CM

## 2014-09-09 MED ORDER — OMEPRAZOLE 40 MG PO CPDR: 40.0000 mg | DELAYED_RELEASE_CAPSULE | Freq: Every day | ORAL | Status: DC

## 2014-09-09 NOTE — Assessment & Plan Note (Signed)
Last month complaining of chest pain, chest x-ray and EKG were negative, she continue with the atypical infrequent chest pain. She also complains of GERD type of symptoms. Plan: PPIs, call soon if no improvement, may need further workup (She did have a car trip in July, PE workup?)

## 2014-09-09 NOTE — Progress Notes (Signed)
Subjective:    Patient ID: Samantha Gould, female    DOB: Aug 07, 1985, 29 y.o.   MRN: [id number]  DOS:  09/09/2014 Type of visit - description : Follow-up Interval history: Headache: Headache quickly resolve after the last visit, did not need to get Imitrex Chest pain: Continue with chest pain as described before, every few  days, lasts less than a minute. She wonders if it is related to her stomach: Reports that for the last 2 months is having heartburn, a new symptom to her, described as burning at the upper chest to the throat.  Review of Systems  Denies shortness of breath or lower extremity edema.  Mild DOE for the last 2 or 3 weeks. No nausea, vomiting, diarrhea or blood in the stools. No dysphagia or odynophagia   Past Medical History  Diagnosis Date  . Migraine     Past Surgical History  Procedure Laterality Date  . No past surgeries      Social History   Social History  . Marital Status: Married    Spouse Name: N/A  . Number of Children: N/A  . Years of Education: N/A   Occupational History  . Not on file.   Social History Main Topics  . Smoking status: Former Smoker    Quit date: 08/24/2013  . Smokeless tobacco: Not on file     Comment: 2-3 cig/ day on average  . Alcohol Use: No  . Drug Use: No  . Sexual Activity: Not Currently   Other Topics Concern  . Not on file   Social History Narrative        Medication List       This list is accurate as of: 09/09/14 11:59 PM.  Always use your most recent med list.               mometasone 50 MCG/ACT nasal spray  Commonly known as:  NASONEX  2 sprays each nostril q day     Norgestimate-Ethinyl Estradiol Triphasic 0.18/0.215/0.25 MG-35 MCG tablet  Commonly known as:  TRI-SPRINTEC  Take 1 tablet by mouth daily.     omeprazole 40 MG capsule  Commonly known as:  PRILOSEC  Take 1 capsule (40 mg total) by mouth daily.     SUMAtriptan 100 MG tablet  Commonly known as:  IMITREX  Take 1 tablet  (100 mg total) by mouth daily as needed for migraine. May repeat a second tablet 2 hours after first if the headache  continue but No more than 2 tablets in a 24-hour period           Objective:   Physical Exam BP 126/78 mmHg  Pulse 111  Temp(Src) 98.3 F (36.8 C) (Oral)  Ht  (1.676 m)  Wt 193 lb 4 oz (87.658 kg)  BMI 31.21 kg/m2  SpO2 99%  LMP 08/09/2014 General:   Well developed, well nourished . NAD.  HEENT:  Normocephalic . Face symmetric, atraumatic Lungs:  CTA B Normal respiratory effort, no intercostal retractions, no accessory muscle use. Heart: RRR,  no murmur.( HR recheck: 88)  no pretibial edema bilaterally, calves symmetric  Abdomen:  Not distended, soft, non-tender. No rebound or rigidity. No mass,organomegaly Skin: Not pale. Not jaundice Neurologic:  alert & oriented X3.  Speech normal, gait appropriate for age and unassisted Psych--  Cognition and judgment appear intact.  Cooperative with normal attention span and concentration.  Behavior appropriate. No anxious or depressed appearing.    Assessment & Plan:

## 2014-09-09 NOTE — Assessment & Plan Note (Signed)
Patient quickly responded to therapy provided 08/12/2014, headache has not returned, has not needed Imitrex. Recommend to use Imitrex as needed

## 2014-09-09 NOTE — Patient Instructions (Signed)
Start omeprazole 40 mg one tablet before breakfast every day  Call in 10 days if the chest pain is not resolved

## 2014-09-09 NOTE — Progress Notes (Signed)
Pre visit review using our clinic review tool, if applicable. No additional management support is needed unless otherwise documented below in the visit note. 

## 2014-10-25 ENCOUNTER — Ambulatory Visit (INDEPENDENT_AMBULATORY_CARE_PROVIDER_SITE_OTHER): Payer: BLUE CROSS/BLUE SHIELD | Admitting: Internal Medicine

## 2014-10-25 ENCOUNTER — Encounter: Payer: Self-pay | Admitting: Internal Medicine

## 2014-10-25 VITALS — BP 116/82 | HR 98 | Temp 97.6°F | Ht 66.0 in | Wt 198.2 lb

## 2014-10-25 DIAGNOSIS — Z09 Encounter for follow-up examination after completed treatment for conditions other than malignant neoplasm: Secondary | ICD-10-CM

## 2014-10-25 DIAGNOSIS — J0100 Acute maxillary sinusitis, unspecified: Secondary | ICD-10-CM | POA: Diagnosis not present

## 2014-10-25 MED ORDER — AMOXICILLIN 500 MG PO CAPS: 1000.0000 mg | ORAL_CAPSULE | Freq: Two times a day (BID) | ORAL | Status: DC

## 2014-10-25 MED ORDER — AZELASTINE HCL 0.1 % NA SOLN: 2.0000 | Freq: Every evening | NASAL | Status: DC | PRN

## 2014-10-25 MED ORDER — KETOROLAC TROMETHAMINE 10 MG PO TABS: 10.0000 mg | ORAL_TABLET | Freq: Four times a day (QID) | ORAL | Status: DC | PRN

## 2014-10-25 NOTE — Progress Notes (Signed)
Pre visit review using our clinic review tool, if applicable. No additional management support is needed unless otherwise documented below in the visit note. 

## 2014-10-25 NOTE — Progress Notes (Signed)
Subjective:    Patient ID: Samantha Gould, female    DOB: 1985/09/01, 29 y.o.   MRN: [id number]  DOS:  10/25/2014 Type of visit - description : acute Interval history: Sx started ~ 1 weeks ago with watery eyes, persisting, left-sided sinus headache, pain behind the L eye. This time sx are somewhat different to her previous migraines because she has no light or noise sesitivity and no associated nausea. She is also having green nasal discharge from the nose.    Review of Systems No fever, some chills. Had sore throat initially but that is gone. She has a dry cough without chest congestion or wheezing  Past Medical History  Diagnosis Date  . Migraine     Past Surgical History  Procedure Laterality Date  . No past surgeries      Social History   Social History  . Marital Status: Married    Spouse Name: N/A  . Number of Children: N/A  . Years of Education: N/A   Occupational History  . Not on file.   Social History Main Topics  . Smoking status: Former Smoker    Quit date: 08/24/2013  . Smokeless tobacco: Not on file     Comment: 2-3 cig/ day on average  . Alcohol Use: No  . Drug Use: No  . Sexual Activity: Not Currently   Other Topics Concern  . Not on file   Social History Narrative        Medication List       This list is accurate as of: 10/25/14 11:59 PM.  Always use your most recent med list.               amoxicillin 500 MG capsule  Commonly known as:  AMOXIL  Take 2 capsules (1,000 mg total) by mouth 2 (two) times daily.     azelastine 0.1 % nasal spray  Commonly known as:  ASTELIN  Place 2 sprays into both nostrils at bedtime as needed for rhinitis. Use in each nostril as directed     ketorolac 10 MG tablet  Commonly known as:  TORADOL  Take 1 tablet (10 mg total) by mouth every 6 (six) hours as needed.     mometasone 50 MCG/ACT nasal spray  Commonly known as:  NASONEX  2 sprays each nostril q day     Norgestimate-Ethinyl Estradiol  Triphasic 0.18/0.215/0.25 MG-35 MCG tablet  Commonly known as:  TRI-SPRINTEC  Take 1 tablet by mouth daily.     omeprazole 40 MG capsule  Commonly known as:  PRILOSEC  Take 1 capsule (40 mg total) by mouth daily.     SUMAtriptan 100 MG tablet  Commonly known as:  IMITREX  Take 1 tablet (100 mg total) by mouth daily as needed for migraine. May repeat a second tablet 2 hours after first if the headache  continue but No more than 2 tablets in a 24-hour period           Objective:   Physical Exam BP 116/82 mmHg  Pulse 98  Temp(Src) 97.6 F (36.4 C) (Oral)  Ht  (1.676 m)  Wt 198 lb 4 oz (89.926 kg)  BMI 32.01 kg/m2  SpO2 96%  LMP 10/04/2014 (Approximate) General:   Well developed, well nourished . NAD.  HEENT:  Normocephalic . Face symmetric, atraumatic. EOMI, pupils equal and reactive TMs normal, sinus no TTP. Nose congested. Throat symmetric, no red Lungs:  CTA B Normal respiratory effort, no intercostal retractions, no  accessory muscle use. Heart: RRR,  no murmur.  No pretibial edema bilaterally  Skin: Not pale. Not jaundice Neurologic:  alert & oriented X3.  Speech normal, gait appropriate for age and unassisted Psych--  Cognition and judgment appear intact.  Cooperative with normal attention span and concentration.  Behavior appropriate. No anxious or depressed appearing.      Assessment & Plan:  Plan  Sinusitis: Suspect symptoms related to sinusitis rather than migraines given description of the headache. Will treat accordingly. See instructions.

## 2014-10-25 NOTE — Patient Instructions (Signed)
Rest, fluids  For pain, take Tylenol 500 mg 2 tablets 3 times a day. If needed, use Toradol, prescription sent.    For nasal congestion Use Astelin nasal spray and at night and  Flonase  In the morning   Take the antibiotic as prescribed  (Amoxicillin)  Call if not gradually better over the next  10 days  Call anytime if the symptoms are severe  Birth control pills may not be as effective when you take antibiotics

## 2014-10-26 DIAGNOSIS — Z09 Encounter for follow-up examination after completed treatment for conditions other than malignant neoplasm: Secondary | ICD-10-CM | POA: Insufficient documentation

## 2014-10-26 NOTE — Assessment & Plan Note (Signed)
Sinusitis: Suspect symptoms related to sinusitis rather than migraines given description of the headache. Will treat accordingly.

## 2015-01-22 NOTE — L&D Delivery Note (Signed)
Delivery Note At 1:11 am a viable and healthy female was delivered via  (Presentation:vtx ; OA ).  APGAR:8 ,9 ; weight   6lb 12 oz   Placenta status: spontaneous intact sent to path due to [facility], .  Cord:  with the following complications:  Cord around left foot.  Cord pH: none  Anesthesia:  epiduraL Episiotomy:  NONE Lacerations:  2nd perineal Suture Repair: 3.0 chromic Est. Blood Loss (mL):    Mom to postpartum.  Baby to Couplet care / Skin to Skin.  Samantha Gould A 08/16/2015, 1:39 AM

## 2015-01-24 ENCOUNTER — Encounter: Payer: Self-pay | Admitting: Internal Medicine

## 2015-01-25 ENCOUNTER — Other Ambulatory Visit: Payer: Self-pay

## 2015-01-25 ENCOUNTER — Encounter: Payer: Self-pay | Admitting: Internal Medicine

## 2015-01-25 NOTE — Telephone Encounter (Signed)
We call the patient, she already got an appointment to see gynecology. Pregnancy confirmed

## 2015-01-25 NOTE — Telephone Encounter (Signed)
Pregnancy status updated in chart.

## 2015-02-28 LAB — OB RESULTS CONSOLE HIV ANTIBODY (ROUTINE TESTING): HIV: NONREACTIVE

## 2015-02-28 LAB — OB RESULTS CONSOLE ABO/RH: RH Type: POSITIVE

## 2015-02-28 LAB — OB RESULTS CONSOLE RPR: RPR: NONREACTIVE

## 2015-02-28 LAB — OB RESULTS CONSOLE GC/CHLAMYDIA
CHLAMYDIA, DNA PROBE: NEGATIVE
Gonorrhea: NEGATIVE

## 2015-02-28 LAB — OB RESULTS CONSOLE RUBELLA ANTIBODY, IGM: RUBELLA: IMMUNE

## 2015-02-28 LAB — OB RESULTS CONSOLE HEPATITIS B SURFACE ANTIGEN: HEP B S AG: NEGATIVE

## 2015-05-01 DIAGNOSIS — Z3A19 19 weeks gestation of pregnancy: Secondary | ICD-10-CM | POA: Diagnosis not present

## 2015-05-01 DIAGNOSIS — O4442 Low lying placenta NOS or without hemorrhage, second trimester: Secondary | ICD-10-CM | POA: Diagnosis not present

## 2015-05-11 DIAGNOSIS — R300 Dysuria: Secondary | ICD-10-CM | POA: Diagnosis not present

## 2015-05-11 DIAGNOSIS — O26892 Other specified pregnancy related conditions, second trimester: Secondary | ICD-10-CM | POA: Diagnosis not present

## 2015-05-26 DIAGNOSIS — Z3A22 22 weeks gestation of pregnancy: Secondary | ICD-10-CM | POA: Diagnosis not present

## 2015-05-26 DIAGNOSIS — O4442 Low lying placenta NOS or without hemorrhage, second trimester: Secondary | ICD-10-CM | POA: Diagnosis not present

## 2015-06-13 DIAGNOSIS — Z3A25 25 weeks gestation of pregnancy: Secondary | ICD-10-CM | POA: Diagnosis not present

## 2015-06-13 DIAGNOSIS — R81 Glycosuria: Secondary | ICD-10-CM | POA: Diagnosis not present

## 2015-06-13 DIAGNOSIS — O358XX1 Maternal care for other (suspected) fetal abnormality and damage, fetus 1: Secondary | ICD-10-CM | POA: Diagnosis not present

## 2015-06-13 DIAGNOSIS — O358XX0 Maternal care for other (suspected) fetal abnormality and damage, not applicable or unspecified: Secondary | ICD-10-CM | POA: Diagnosis not present

## 2015-06-13 DIAGNOSIS — O4442 Low lying placenta NOS or without hemorrhage, second trimester: Secondary | ICD-10-CM | POA: Diagnosis not present

## 2015-07-10 DIAGNOSIS — Z36 Encounter for antenatal screening of mother: Secondary | ICD-10-CM | POA: Diagnosis not present

## 2015-07-10 DIAGNOSIS — Z3A29 29 weeks gestation of pregnancy: Secondary | ICD-10-CM | POA: Diagnosis not present

## 2015-07-10 DIAGNOSIS — O3663X0 Maternal care for excessive fetal growth, third trimester, not applicable or unspecified: Secondary | ICD-10-CM | POA: Diagnosis not present

## 2015-07-17 DIAGNOSIS — O99810 Abnormal glucose complicating pregnancy: Secondary | ICD-10-CM | POA: Diagnosis not present

## 2015-07-24 ENCOUNTER — Encounter: Payer: Self-pay | Admitting: Skilled Nursing Facility1

## 2015-07-24 ENCOUNTER — Encounter: Payer: BLUE CROSS/BLUE SHIELD | Attending: Obstetrics and Gynecology | Admitting: Skilled Nursing Facility1

## 2015-07-24 VITALS — Ht 66.0 in | Wt 228.0 lb

## 2015-07-24 DIAGNOSIS — Z713 Dietary counseling and surveillance: Secondary | ICD-10-CM | POA: Insufficient documentation

## 2015-07-24 DIAGNOSIS — O99810 Abnormal glucose complicating pregnancy: Secondary | ICD-10-CM | POA: Insufficient documentation

## 2015-07-24 DIAGNOSIS — O24410 Gestational diabetes mellitus in pregnancy, diet controlled: Secondary | ICD-10-CM

## 2015-07-24 NOTE — Progress Notes (Signed)
  Patient was seen on 07/24/2015 for Gestational Diabetes self-management class at the [facility]. The following learning objectives were met by the patient during this course:   States the definition of Gestational Diabetes  States why dietary management is important in controlling blood glucose  Describes the effects each nutrient has on blood glucose levels  Demonstrates ability to create a balanced meal plan  Demonstrates carbohydrate counting   States when to check blood glucose levels involving a total of 4 separate occurences in a day  Demonstrates proper blood glucose monitoring techniques  States the effect of stress and exercise on blood glucose levels  States the importance of limiting caffeine and abstaining from alcohol and smoking  Demonstrates the knowledge the glucometer provided in class may not be covered by their insurance and to call their insurance provider immediately after class to know which glucometer their insurance provider does cover as well as calling their physician the next day for a prescription to the glucometer their insurance does cover (if the one provided is not) as well as the lancets and strips for that meter.  Blood glucose monitor given: one touch verio flex and contour one (contour does not have strips in it so the one touch was also given) Lot # [id number] Exp: 2016/08/20/2/28/18 Blood glucose reading: 189  Patient instructed to monitor glucose levels: FBS: 60 - <90 1 hour: <140 2 hour: <120  *Patient received handouts:  Nutrition Diabetes and Pregnancy  Carbohydrate Counting List  Patient will be seen for follow-up as needed.

## 2015-07-26 ENCOUNTER — Encounter (HOSPITAL_COMMUNITY): Payer: Self-pay | Admitting: *Deleted

## 2015-07-26 ENCOUNTER — Inpatient Hospital Stay (HOSPITAL_COMMUNITY)
Admission: AD | Admit: 2015-07-26 | Discharge: 2015-07-26 | Disposition: A | Discharge status: Home / Self Care | Payer: BLUE CROSS/BLUE SHIELD | Source: Ambulatory Visit | Attending: Obstetrics and Gynecology | Admitting: Obstetrics and Gynecology

## 2015-07-26 ENCOUNTER — Inpatient Hospital Stay (HOSPITAL_COMMUNITY): Payer: BLUE CROSS/BLUE SHIELD

## 2015-07-26 DIAGNOSIS — O368190 Decreased fetal movements, unspecified trimester, not applicable or unspecified: Secondary | ICD-10-CM | POA: Diagnosis present

## 2015-07-26 DIAGNOSIS — O368130 Decreased fetal movements, third trimester, not applicable or unspecified: Secondary | ICD-10-CM | POA: Diagnosis not present

## 2015-07-26 DIAGNOSIS — Z23 Encounter for immunization: Secondary | ICD-10-CM | POA: Diagnosis not present

## 2015-07-26 DIAGNOSIS — Z3A31 31 weeks gestation of pregnancy: Secondary | ICD-10-CM | POA: Insufficient documentation

## 2015-07-26 HISTORY — DX: Essential (primary) hypertension: I10

## 2015-07-26 NOTE — Discharge Instructions (Signed)
Daily kick ct, PTL precautions Fetal Movement Counts Patient Name: __________________________________________________ Patient Due Date: ____________________ Performing a fetal movement count is highly recommended in high-risk pregnancies, but it is good for every pregnant woman to do. Your health care provider may ask you to start counting fetal movements at 28 weeks of the pregnancy. Fetal movements often increase:  After eating a full meal.  After physical activity.  After eating or drinking something sweet or cold.  At rest. Pay attention to when you feel the baby is most active. This will help you notice a pattern of your baby's sleep and wake cycles and what factors contribute to an increase in fetal movement. It is important to perform a fetal movement count at the same time each day when your baby is normally most active.  HOW TO COUNT FETAL MOVEMENTS  Find a quiet and comfortable area to sit or lie down on your left side. Lying on your left side provides the best blood and oxygen circulation to your baby.  Write down the day and time on a sheet of paper or in a journal.  Start counting kicks, flutters, swishes, rolls, or jabs in a 2-hour period. You should feel at least 10 movements within 2 hours.  If you do not feel 10 movements in 2 hours, wait 2-3 hours and count again. Look for a change in the pattern or not enough counts in 2 hours. SEEK MEDICAL CARE IF:  You feel less than 10 counts in 2 hours, tried twice.  There is no movement in over an hour.  The pattern is changing or taking longer each day to reach 10 counts in 2 hours.  You feel the baby is not moving as he or she usually does. Date: ____________ Movements: ____________ Start time: ____________ Doreatha MartinFinish time: ____________  Date: ____________ Movements: ____________ Start time: ____________ Doreatha MartinFinish time: ____________ Date: ____________ Movements: ____________ Start time: ____________ Doreatha MartinFinish time: ____________ Date:  ____________ Movements: ____________ Start time: ____________ Doreatha MartinFinish time: ____________ Date: ____________ Movements: ____________ Start time: ____________ Doreatha MartinFinish time: ____________ Date: ____________ Movements: ____________ Start time: ____________ Doreatha MartinFinish time: ____________ Date: ____________ Movements: ____________ Start time: ____________ Doreatha MartinFinish time: ____________ Date: ____________ Movements: ____________ Start time: ____________ Doreatha MartinFinish time: ____________  Date: ____________ Movements: ____________ Start time: ____________ Doreatha MartinFinish time: ____________ Date: ____________ Movements: ____________ Start time: ____________ Doreatha MartinFinish time: ____________ Date: ____________ Movements: ____________ Start time: ____________ Doreatha MartinFinish time: ____________ Date: ____________ Movements: ____________ Start time: ____________ Doreatha MartinFinish time: ____________ Date: ____________ Movements: ____________ Start time: ____________ Doreatha MartinFinish time: ____________ Date: ____________ Movements: ____________ Start time: ____________ Doreatha MartinFinish time: ____________ Date: ____________ Movements: ____________ Start time: ____________ Doreatha MartinFinish time: ____________  Date: ____________ Movements: ____________ Start time: ____________ Doreatha MartinFinish time: ____________ Date: ____________ Movements: ____________ Start time: ____________ Doreatha MartinFinish time: ____________ Date: ____________ Movements: ____________ Start time: ____________ Doreatha MartinFinish time: ____________ Date: ____________ Movements: ____________ Start time: ____________ Doreatha MartinFinish time: ____________ Date: ____________ Movements: ____________ Start time: ____________ Doreatha MartinFinish time: ____________ Date: ____________ Movements: ____________ Start time: ____________ Doreatha MartinFinish time: ____________ Date: ____________ Movements: ____________ Start time: ____________ Doreatha MartinFinish time: ____________  Date: ____________ Movements: ____________ Start time: ____________ Doreatha MartinFinish time: ____________ Date: ____________ Movements: ____________ Start  time: ____________ Doreatha MartinFinish time: ____________ Date: ____________ Movements: ____________ Start time: ____________ Doreatha MartinFinish time: ____________ Date: ____________ Movements: ____________ Start time: ____________ Doreatha MartinFinish time: ____________ Date: ____________ Movements: ____________ Start time: ____________ Doreatha MartinFinish time: ____________ Date: ____________ Movements: ____________ Start time: ____________ Doreatha MartinFinish time: ____________ Date: ____________ Movements: ____________ Start time: ____________ Doreatha MartinFinish time: ____________  Date: ____________ Movements:  ____________ Start time: ____________ Doreatha MartinFinish time: ____________ Date: ____________ Movements: ____________ Start time: ____________ Doreatha MartinFinish time: ____________ Date: ____________ Movements: ____________ Start time: ____________ Doreatha MartinFinish time: ____________ Date: ____________ Movements: ____________ Start time: ____________ Doreatha MartinFinish time: ____________ Date: ____________ Movements: ____________ Start time: ____________ Doreatha MartinFinish time: ____________ Date: ____________ Movements: ____________ Start time: ____________ Doreatha MartinFinish time: ____________ Date: ____________ Movements: ____________ Start time: ____________ Doreatha MartinFinish time: ____________  Date: ____________ Movements: ____________ Start time: ____________ Doreatha MartinFinish time: ____________ Date: ____________ Movements: ____________ Start time: ____________ Doreatha MartinFinish time: ____________ Date: ____________ Movements: ____________ Start time: ____________ Doreatha MartinFinish time: ____________ Date: ____________ Movements: ____________ Start time: ____________ Doreatha MartinFinish time: ____________ Date: ____________ Movements: ____________ Start time: ____________ Doreatha MartinFinish time: ____________ Date: ____________ Movements: ____________ Start time: ____________ Doreatha MartinFinish time: ____________ Date: ____________ Movements: ____________ Start time: ____________ Doreatha MartinFinish time: ____________  Date: ____________ Movements: ____________ Start time: ____________ Doreatha MartinFinish time:  ____________ Date: ____________ Movements: ____________ Start time: ____________ Doreatha MartinFinish time: ____________ Date: ____________ Movements: ____________ Start time: ____________ Doreatha MartinFinish time: ____________ Date: ____________ Movements: ____________ Start time: ____________ Doreatha MartinFinish time: ____________ Date: ____________ Movements: ____________ Start time: ____________ Doreatha MartinFinish time: ____________ Date: ____________ Movements: ____________ Start time: ____________ Doreatha MartinFinish time: ____________ Date: ____________ Movements: ____________ Start time: ____________ Doreatha MartinFinish time: ____________  Date: ____________ Movements: ____________ Start time: ____________ Doreatha MartinFinish time: ____________ Date: ____________ Movements: ____________ Start time: ____________ Doreatha MartinFinish time: ____________ Date: ____________ Movements: ____________ Start time: ____________ Doreatha MartinFinish time: ____________ Date: ____________ Movements: ____________ Start time: ____________ Doreatha MartinFinish time: ____________ Date: ____________ Movements: ____________ Start time: ____________ Doreatha MartinFinish time: ____________ Date: ____________ Movements: ____________ Start time: ____________ Doreatha MartinFinish time: ____________   This information is not intended to replace advice given to you by your health care provider. Make sure you discuss any questions you have with your health care provider.   Document Released: 02/06/2006 Document Revised: 01/28/2014 Document Reviewed: 11/04/2011 Elsevier Interactive Patient Education 2016 [employer] Preterm Labor Information Preterm labor is when labor starts at less than 37 weeks of pregnancy. The normal length of a pregnancy is 39 to 41 weeks. CAUSES Often, there is no identifiable underlying cause as to why a woman goes into preterm labor. One of the most common known causes of preterm labor is infection. Infections of the uterus, cervix, vagina, amniotic sac, bladder, kidney, or even the lungs (pneumonia) can cause labor to start. Other suspected  causes of preterm labor include:   Urogenital infections, such as yeast infections and bacterial vaginosis.   Uterine abnormalities (uterine shape, uterine septum, fibroids, or bleeding from the placenta).   A cervix that has been operated on (it may fail to stay closed).   Malformations in the fetus.   Multiple gestations (twins, triplets, and so on).   Breakage of the amniotic sac.  RISK FACTORS  Having a previous history of preterm labor.   Having premature rupture of membranes (PROM).   Having a placenta that covers the opening of the cervix (placenta previa).   Having a placenta that separates from the uterus (placental abruption).   Having a cervix that is too weak to hold the fetus in the uterus (incompetent cervix).   Having too much fluid in the amniotic sac (polyhydramnios).   Taking illegal drugs or smoking while pregnant.   Not gaining enough weight while pregnant.   Being younger than 6418 and older than 31 years old.   Having a low socioeconomic status.   Being African American. SYMPTOMS Signs and symptoms of preterm labor include:   Menstrual-like cramps, abdominal pain, or back pain.  Uterine  contractions that are regular, as frequent as six in an hour, regardless of their intensity (may be mild or painful).  Contractions that start on the top of the uterus and spread down to the lower abdomen and back.   A sense of increased pelvic pressure.   A watery or bloody mucus discharge that comes from the vagina.  TREATMENT Depending on the length of the pregnancy and other circumstances, your health care provider may suggest bed rest. If necessary, there are medicines that can be given to stop contractions and to mature the fetal lungs. If labor happens before 34 weeks of pregnancy, a prolonged hospital stay may be recommended. Treatment depends on the condition of both you and the fetus.  WHAT SHOULD YOU DO IF YOU THINK YOU ARE IN PRETERM  LABOR? Call your health care provider right away. You will need to go to the hospital to get checked immediately. HOW CAN YOU PREVENT PRETERM LABOR IN FUTURE PREGNANCIES? You should:   Stop smoking if you smoke.  Maintain healthy weight gain and avoid chemicals and drugs that are not necessary.  Be watchful for any type of infection.  Inform your health care provider if you have a known history of preterm labor.   This information is not intended to replace advice given to you by your health care provider. Make sure you discuss any questions you have with your health care provider.   Document Released: 03/30/2003 Document Revised: 09/09/2012 Document Reviewed: 02/10/2012 Elsevier Interactive Patient Education 2016 [employer]. Preterm Labor Information Preterm labor is when labor starts at less than 37 weeks of pregnancy. The normal length of a pregnancy is 39 to 41 weeks. CAUSES Often, there is no identifiable underlying cause as to why a woman goes into preterm labor. One of the most common known causes of preterm labor is infection. Infections of the uterus, cervix, vagina, amniotic sac, bladder, kidney, or even the lungs (pneumonia) can cause labor to start. Other suspected causes of preterm labor include:   Urogenital infections, such as yeast infections and bacterial vaginosis.   Uterine abnormalities (uterine shape, uterine septum, fibroids, or bleeding from the placenta).   A cervix that has been operated on (it may fail to stay closed).   Malformations in the fetus.   Multiple gestations (twins, triplets, and so on).   Breakage of the amniotic sac.  RISK FACTORS  Having a previous history of preterm labor.   Having premature rupture of membranes (PROM).   Having a placenta that covers the opening of the cervix (placenta previa).   Having a placenta that separates from the uterus (placental abruption).   Having a cervix that is too weak to hold the  fetus in the uterus (incompetent cervix).   Having too much fluid in the amniotic sac (polyhydramnios).   Taking illegal drugs or smoking while pregnant.   Not gaining enough weight while pregnant.   Being younger than 93 and older than 31 years old.   Having a low socioeconomic status.   Being African American. SYMPTOMS Signs and symptoms of preterm labor include:   Menstrual-like cramps, abdominal pain, or back pain.  Uterine contractions that are regular, as frequent as six in an hour, regardless of their intensity (may be mild or painful).  Contractions that start on the top of the uterus and spread down to the lower abdomen and back.   A sense of increased pelvic pressure.   A watery or bloody mucus discharge that comes from the  vagina.  TREATMENT Depending on the length of the pregnancy and other circumstances, your health care provider may suggest bed rest. If necessary, there are medicines that can be given to stop contractions and to mature the fetal lungs. If labor happens before 34 weeks of pregnancy, a prolonged hospital stay may be recommended. Treatment depends on the condition of both you and the fetus.  WHAT SHOULD YOU DO IF YOU THINK YOU ARE IN PRETERM LABOR? Call your health care provider right away. You will need to go to the hospital to get checked immediately. HOW CAN YOU PREVENT PRETERM LABOR IN FUTURE PREGNANCIES? You should:   Stop smoking if you smoke.  Maintain healthy weight gain and avoid chemicals and drugs that are not necessary.  Be watchful for any type of infection.  Inform your health care provider if you have a known history of preterm labor.   This information is not intended to replace advice given to you by your health care provider. Make sure you discuss any questions you have with your health care provider.   Document Released: 03/30/2003 Document Revised: 09/09/2012 Document Reviewed: 02/10/2012 Elsevier Interactive Patient  Education [employer]

## 2015-07-26 NOTE — MAU Note (Signed)
Urine in lab 

## 2015-07-26 NOTE — MAU Provider Note (Signed)
History     No chief complaint on file. cc: decreased fetal movement, NR  NST   OB History    Gravida Para Term Preterm AB TAB SAB Ectopic Multiple Living   1               Past Medical History  Diagnosis Date  . Migraine   . Currently pregnant 01/2011  . Gestational diabetes   . Hypertension     with pregnancy only    Past Surgical History  Procedure Laterality Date  . No past surgeries      Family History  Problem Relation Age of Onset  . Migraines Sister   . Breast cancer Maternal Grandmother   . Diabetes Maternal Grandmother   . Sleep apnea Father   . Sleep apnea Maternal Grandfather   . Diabetes Mother     Social History  Substance Use Topics  . Smoking status: Former Smoker    Quit date: 08/24/2013  . Smokeless tobacco: None     Comment: 2-3 cig/ day on average  . Alcohol Use: No    Allergies: No Known Allergies  Prescriptions prior to admission  Medication Sig Dispense Refill Last Dose  . amoxicillin (AMOXIL) 500 MG capsule Take 2 capsules (1,000 mg total) by mouth 2 (two) times daily. 40 capsule 0   . azelastine (ASTELIN) 0.1 % nasal spray Place 2 sprays into both nostrils at bedtime as needed for rhinitis. Use in each nostril as directed 30 mL 3   . ketorolac (TORADOL) 10 MG tablet Take 1 tablet (10 mg total) by mouth every 6 (six) hours as needed. 20 tablet 0   . mometasone (NASONEX) 50 MCG/ACT nasal spray 2 sprays each nostril q day 17 g 1 Taking  . Norgestimate-Ethinyl Estradiol Triphasic (TRI-SPRINTEC) 0.18/0.215/0.25 MG-35 MCG tablet Take 1 tablet by mouth daily. 1 Package 11 Taking  . omeprazole (PRILOSEC) 40 MG capsule Take 1 capsule (40 mg total) by mouth daily. 30 capsule 6 Taking  . SUMAtriptan (IMITREX) 100 MG tablet Take 1 tablet (100 mg total) by mouth daily as needed for migraine. May repeat a second tablet 2 hours after first if the headache  continue but No more than 2 tablets in a 24-hour period (Patient not taking: Reported on  10/25/2014) 10 tablet 0 Not Taking     Physical Exam   Blood pressure 120/82, pulse 105, last menstrual period 10/04/2014.  General appearance: alert, cooperative and no distress Abdomen: gravid nontender no palp ctx Pelvic: external genitalia normal and Long/closed/posterior Extremities: no edema, redness or tenderness in the calves or thighs   Tracing: baseline 140 small accel irreg ctx  Sono: BPP 8/8, nl fluid  IMP: Reassuring fetal testing Class A1 GDM P) d/c home keep sched appt, Daily kick ct D/c instructions reviewed  ED Course   MDM   Zyonna Vardaman A, MD 7:08 PM 07/26/2015

## 2015-08-09 ENCOUNTER — Inpatient Hospital Stay (HOSPITAL_COMMUNITY): Payer: BLUE CROSS/BLUE SHIELD

## 2015-08-09 ENCOUNTER — Encounter (HOSPITAL_COMMUNITY): Payer: Self-pay | Admitting: *Deleted

## 2015-08-09 ENCOUNTER — Inpatient Hospital Stay (HOSPITAL_COMMUNITY)
Admission: AD | Admit: 2015-08-09 | Discharge: 2015-08-09 | Disposition: A | Discharge status: Home / Self Care | Payer: BLUE CROSS/BLUE SHIELD | Source: Ambulatory Visit | Attending: Obstetrics and Gynecology | Admitting: Obstetrics and Gynecology

## 2015-08-09 DIAGNOSIS — Z3A33 33 weeks gestation of pregnancy: Secondary | ICD-10-CM

## 2015-08-09 DIAGNOSIS — O139 Gestational [pregnancy-induced] hypertension without significant proteinuria, unspecified trimester: Secondary | ICD-10-CM | POA: Diagnosis not present

## 2015-08-09 DIAGNOSIS — R109 Unspecified abdominal pain: Secondary | ICD-10-CM | POA: Insufficient documentation

## 2015-08-09 DIAGNOSIS — H538 Other visual disturbances: Secondary | ICD-10-CM | POA: Diagnosis not present

## 2015-08-09 DIAGNOSIS — O1003 Pre-existing essential hypertension complicating the puerperium: Secondary | ICD-10-CM | POA: Diagnosis not present

## 2015-08-09 DIAGNOSIS — O99213 Obesity complicating pregnancy, third trimester: Secondary | ICD-10-CM | POA: Diagnosis not present

## 2015-08-09 DIAGNOSIS — R51 Headache: Secondary | ICD-10-CM | POA: Diagnosis not present

## 2015-08-09 DIAGNOSIS — O26893 Other specified pregnancy related conditions, third trimester: Secondary | ICD-10-CM | POA: Diagnosis not present

## 2015-08-09 DIAGNOSIS — O133 Gestational [pregnancy-induced] hypertension without significant proteinuria, third trimester: Secondary | ICD-10-CM | POA: Diagnosis not present

## 2015-08-09 DIAGNOSIS — O24419 Gestational diabetes mellitus in pregnancy, unspecified control: Secondary | ICD-10-CM | POA: Diagnosis not present

## 2015-08-09 DIAGNOSIS — I10 Essential (primary) hypertension: Secondary | ICD-10-CM

## 2015-08-09 DIAGNOSIS — O24410 Gestational diabetes mellitus in pregnancy, diet controlled: Secondary | ICD-10-CM | POA: Diagnosis not present

## 2015-08-09 LAB — PROTEIN / CREATININE RATIO, URINE
CREATININE, URINE: 87 mg/dL
PROTEIN CREATININE RATIO: 0.1 mg/mg{creat} (ref 0.00–0.15)
TOTAL PROTEIN, URINE: 9 mg/dL

## 2015-08-09 LAB — URIC ACID: URIC ACID, SERUM: 4.9 mg/dL (ref 2.3–6.6)

## 2015-08-09 LAB — COMPREHENSIVE METABOLIC PANEL
ALT: 19 U/L (ref 14–54)
ANION GAP: 7 (ref 5–15)
AST: 24 U/L (ref 15–41)
Albumin: 2.5 g/dL — ABNORMAL LOW (ref 3.5–5.0)
Alkaline Phosphatase: 140 U/L — ABNORMAL HIGH (ref 38–126)
BUN: 12 mg/dL (ref 6–20)
CHLORIDE: 106 mmol/L (ref 101–111)
CO2: 19 mmol/L — ABNORMAL LOW (ref 22–32)
Calcium: 8.6 mg/dL — ABNORMAL LOW (ref 8.9–10.3)
Creatinine, Ser: 0.65 mg/dL (ref 0.44–1.00)
GFR calc Af Amer: >60 mL/min (ref >60)
Glucose, Bld: 95 mg/dL (ref 65–99)
POTASSIUM: 3.6 mmol/L (ref 3.5–5.1)
Sodium: 132 mmol/L — ABNORMAL LOW (ref 135–145)
TOTAL PROTEIN: 5.7 g/dL — AB (ref 6.5–8.1)
Total Bilirubin: 0.7 mg/dL (ref 0.3–1.2)

## 2015-08-09 LAB — URINALYSIS, ROUTINE W REFLEX MICROSCOPIC
BILIRUBIN URINE: NEGATIVE
Glucose, UA: NEGATIVE mg/dL
KETONES UR: NEGATIVE mg/dL
NITRITE: NEGATIVE
PH: 6 (ref 5.0–8.0)
Protein, ur: NEGATIVE mg/dL
Specific Gravity, Urine: 1.015 (ref 1.005–1.030)

## 2015-08-09 LAB — CBC
HCT: 35.7 % — ABNORMAL LOW (ref 36.0–46.0)
Hemoglobin: 12.5 g/dL (ref 12.0–15.0)
MCH: 31.1 pg (ref 26.0–34.0)
MCHC: 35 g/dL (ref 30.0–36.0)
MCV: 88.8 fL (ref 78.0–100.0)
PLATELETS: 238 10*3/uL (ref 150–400)
RBC: 4.02 MIL/uL (ref 3.87–5.11)
RDW: 13.5 % (ref 11.5–15.5)
WBC: 8.9 10*3/uL (ref 4.0–10.5)

## 2015-08-09 LAB — URINE MICROSCOPIC-ADD ON

## 2015-08-09 LAB — LACTATE DEHYDROGENASE: LDH: 188 U/L (ref 98–192)

## 2015-08-09 NOTE — MAU Note (Signed)
C/o blurred vision and spots, headache for past week; c/o cramping for a few week;

## 2015-08-09 NOTE — Discharge Instructions (Signed)

## 2015-08-09 NOTE — MAU Provider Note (Signed)
History     Chief Complaint  Patient presents with  . Hypertension  G1P0 MBF @ 33 2/[redacted] weeks gestation sent from office for lab evaluation for Pre-eclampsia due to 8lb weight gain BP 140/90 , (+) leg swelling and noted visual spots (+) h/a past few week. FH 44 cm. PNC complicated by  Class A1 GDM   OB History    Gravida Para Term Preterm AB TAB SAB Ectopic Multiple Living   1               Past Medical History  Diagnosis Date  . Migraine   . Currently pregnant 01/2011  . Gestational diabetes   . Hypertension     with pregnancy only    Past Surgical History  Procedure Laterality Date  . No past surgeries      Family History  Problem Relation Age of Onset  . Migraines Sister   . Breast cancer Maternal Grandmother   . Diabetes Maternal Grandmother   . Sleep apnea Father   . Sleep apnea Maternal Grandfather   . Diabetes Mother     Social History  Substance Use Topics  . Smoking status: Former Smoker    Quit date: 08/24/2013  . Smokeless tobacco: None     Comment: 2-3 cig/ day on average  . Alcohol Use: No    Allergies: No Known Allergies  Prescriptions prior to admission  Medication Sig Dispense Refill Last Dose  . acetaminophen (TYLENOL) 500 MG tablet Take 1,000 mg by mouth every 6 (six) hours as needed for moderate pain.   08/08/2015 at Unknown time  . calcium carbonate (TUMS - DOSED IN MG ELEMENTAL CALCIUM) 500 MG chewable tablet Chew 2 tablets by mouth 3 (three) times daily as needed for indigestion or heartburn.   Past Week at Unknown time  . Prenatal Vit-Fe Fumarate-FA (PRENATAL MULTIVITAMIN) TABS tablet Take 1 tablet by mouth daily at 12 noon.   Past Month at Unknown time     Physical Exam   Blood pressure 128/92, pulse 86, temperature 98 F (36.7 C), temperature source Oral, resp. rate 18, last menstrual period 10/04/2014.  No exam performed today, done in office. ED Course   addendum: CBC    Component Value Date/Time   WBC 8.9 08/09/2015 1701    RBC 4.02 08/09/2015 1701   HGB 12.5 08/09/2015 1701   HCT 35.7* 08/09/2015 1701   PLT 238 08/09/2015 1701   MCV 88.8 08/09/2015 1701   MCH 31.1 08/09/2015 1701   MCHC 35.0 08/09/2015 1701   RDW 13.5 08/09/2015 1701   LYMPHSABS 2.3 01/24/2014 1048   MONOABS 0.5 01/24/2014 1048   EOSABS 0.2 01/24/2014 1048   BASOSABS 0.1 01/24/2014 1048   CMP     Component Value Date/Time   NA 132* 08/09/2015 1701   K 3.6 08/09/2015 1701   CL 106 08/09/2015 1701   CO2 19* 08/09/2015 1701   GLUCOSE 95 08/09/2015 1701   BUN 12 08/09/2015 1701   CREATININE 0.65 08/09/2015 1701   CALCIUM 8.6* 08/09/2015 1701   PROT 5.7* 08/09/2015 1701   ALBUMIN 2.5* 08/09/2015 1701   AST 24 08/09/2015 1701   ALT 19 08/09/2015 1701   ALKPHOS 140* 08/09/2015 1701   BILITOT 0.7 08/09/2015 1701   GFRNONAA >60 08/09/2015 1701   GFRAA >60 08/09/2015 1701   tracing : baseline 130 (+) accel to 150  reactive Uric acid. 4.9 Protein/creatinine ratio: .10 Sono: 5lb 3 oz ( 75%) nl fluid  IMP: Gestational HTN  Class a1 GDM P) d/c home. F/u 1 wk. PIH warning signs. Reviewed BS log. may use Zantac 150 mg po bid MDM   Jamarie Joplin A, MD 6:28 PM 08/09/2015

## 2015-08-15 ENCOUNTER — Encounter (HOSPITAL_COMMUNITY): Payer: Self-pay | Admitting: *Deleted

## 2015-08-15 ENCOUNTER — Inpatient Hospital Stay (HOSPITAL_COMMUNITY)
Admission: AD | Admit: 2015-08-15 | Discharge: 2015-08-18 | DRG: 775 | Disposition: A | Discharge status: Home / Self Care | Payer: BLUE CROSS/BLUE SHIELD | Source: Ambulatory Visit | Attending: Obstetrics and Gynecology | Admitting: Obstetrics and Gynecology

## 2015-08-15 ENCOUNTER — Inpatient Hospital Stay (HOSPITAL_COMMUNITY): Payer: BLUE CROSS/BLUE SHIELD | Admitting: Anesthesiology

## 2015-08-15 DIAGNOSIS — O134 Gestational [pregnancy-induced] hypertension without significant proteinuria, complicating childbirth: Secondary | ICD-10-CM | POA: Diagnosis not present

## 2015-08-15 DIAGNOSIS — O42913 Preterm premature rupture of membranes, unspecified as to length of time between rupture and onset of labor, third trimester: Secondary | ICD-10-CM | POA: Diagnosis not present

## 2015-08-15 DIAGNOSIS — O99214 Obesity complicating childbirth: Secondary | ICD-10-CM | POA: Diagnosis present

## 2015-08-15 DIAGNOSIS — Z6841 Body Mass Index (BMI) 40.0 and over, adult: Secondary | ICD-10-CM | POA: Diagnosis not present

## 2015-08-15 DIAGNOSIS — Z833 Family history of diabetes mellitus: Secondary | ICD-10-CM

## 2015-08-15 DIAGNOSIS — O24420 Gestational diabetes mellitus in childbirth, diet controlled: Secondary | ICD-10-CM | POA: Diagnosis not present

## 2015-08-15 DIAGNOSIS — Z3A34 34 weeks gestation of pregnancy: Secondary | ICD-10-CM

## 2015-08-15 DIAGNOSIS — Z87891 Personal history of nicotine dependence: Secondary | ICD-10-CM

## 2015-08-15 DIAGNOSIS — O42013 Preterm premature rupture of membranes, onset of labor within 24 hours of rupture, third trimester: Principal | ICD-10-CM | POA: Diagnosis present

## 2015-08-15 DIAGNOSIS — Z803 Family history of malignant neoplasm of breast: Secondary | ICD-10-CM

## 2015-08-15 DIAGNOSIS — O2293 Venous complication in pregnancy, unspecified, third trimester: Secondary | ICD-10-CM | POA: Diagnosis not present

## 2015-08-15 DIAGNOSIS — O42919 Preterm premature rupture of membranes, unspecified as to length of time between rupture and onset of labor, unspecified trimester: Secondary | ICD-10-CM | POA: Diagnosis present

## 2015-08-15 LAB — COMPREHENSIVE METABOLIC PANEL
ALBUMIN: 2.7 g/dL — AB (ref 3.5–5.0)
ALT: 22 U/L (ref 14–54)
AST: 30 U/L (ref 15–41)
Alkaline Phosphatase: 174 U/L — ABNORMAL HIGH (ref 38–126)
Anion gap: 10 (ref 5–15)
BUN: 7 mg/dL (ref 6–20)
CHLORIDE: 103 mmol/L (ref 101–111)
CO2: 17 mmol/L — AB (ref 22–32)
Calcium: 8.5 mg/dL — ABNORMAL LOW (ref 8.9–10.3)
Creatinine, Ser: 0.53 mg/dL (ref 0.44–1.00)
GFR calc Af Amer: >60 mL/min (ref >60)
GFR calc non Af Amer: >60 mL/min (ref >60)
GLUCOSE: 104 mg/dL — AB (ref 65–99)
POTASSIUM: 3.8 mmol/L (ref 3.5–5.1)
SODIUM: 130 mmol/L — AB (ref 135–145)
Total Bilirubin: 0.9 mg/dL (ref 0.3–1.2)
Total Protein: 5.8 g/dL — ABNORMAL LOW (ref 6.5–8.1)

## 2015-08-15 LAB — CBC
HCT: 37 % (ref 36.0–46.0)
HEMATOCRIT: 37.7 % (ref 36.0–46.0)
Hemoglobin: 13 g/dL (ref 12.0–15.0)
Hemoglobin: 13.1 g/dL (ref 12.0–15.0)
MCH: 31 pg (ref 26.0–34.0)
MCH: 30.9 pg (ref 26.0–34.0)
MCHC: 35.1 g/dL (ref 30.0–36.0)
MCHC: 34.7 g/dL (ref 30.0–36.0)
MCV: 88.3 fL (ref 78.0–100.0)
MCV: 88.9 fL (ref 78.0–100.0)
PLATELETS: 245 10*3/uL (ref 150–400)
PLATELETS: 255 10*3/uL (ref 150–400)
RBC: 4.19 MIL/uL (ref 3.87–5.11)
RBC: 4.24 MIL/uL (ref 3.87–5.11)
RDW: 13.6 % (ref 11.5–15.5)
RDW: 13.6 % (ref 11.5–15.5)
WBC: 7.8 10*3/uL (ref 4.0–10.5)
WBC: 13.5 10*3/uL — AB (ref 4.0–10.5)

## 2015-08-15 LAB — GLUCOSE, CAPILLARY
GLUCOSE-CAPILLARY: 115 mg/dL — AB (ref 65–99)
GLUCOSE-CAPILLARY: 119 mg/dL — AB (ref 65–99)
Glucose-Capillary: 97 mg/dL (ref 65–99)
Glucose-Capillary: 150 mg/dL — ABNORMAL HIGH (ref 65–99)

## 2015-08-15 LAB — TYPE AND SCREEN
ABO/RH(D): A POS
ANTIBODY SCREEN: NEGATIVE

## 2015-08-15 LAB — PROTEIN / CREATININE RATIO, URINE
Creatinine, Urine: 62 mg/dL
PROTEIN CREATININE RATIO: 0.11 mg/mg{creat} (ref 0.00–0.15)
Total Protein, Urine: 7 mg/dL

## 2015-08-15 LAB — OB RESULTS CONSOLE GBS: STREP GROUP B AG: NEGATIVE

## 2015-08-15 LAB — ABO/RH: ABO/RH(D): A POS

## 2015-08-15 LAB — URIC ACID: URIC ACID, SERUM: 5.2 mg/dL (ref 2.3–6.6)

## 2015-08-15 LAB — RPR: RPR: NONREACTIVE

## 2015-08-15 LAB — GROUP B STREP BY PCR: GROUP B STREP BY PCR: NEGATIVE

## 2015-08-15 MED ORDER — LIDOCAINE HCL (PF) 1 % IJ SOLN
INTRAMUSCULAR | Status: DC | PRN
  Administered 2015-08-15 (×2): 4 mL

## 2015-08-15 MED ORDER — DEXTROSE 5 % IV SOLN
500.0000 mg | INTRAVENOUS | Status: DC
  Administered 2015-08-15: 500 mg via INTRAVENOUS
  Filled 2015-08-15 (×2): qty 500

## 2015-08-15 MED ORDER — AMOXICILLIN 500 MG PO CAPS: 500.0000 mg | ORAL_CAPSULE | Freq: Three times a day (TID) | ORAL | Status: DC

## 2015-08-15 MED ORDER — GLYBURIDE 2.5 MG PO TABS
2.5000 mg | ORAL_TABLET | Freq: Every day | ORAL | Status: DC
  Filled 2015-08-15 (×2): qty 1

## 2015-08-15 MED ORDER — FENTANYL 2.5 MCG/ML BUPIVACAINE 1/10 % EPIDURAL INFUSION (WH - ANES)
14.0000 mL/h | INTRAMUSCULAR | Status: DC | PRN
  Administered 2015-08-15 (×2): 14 mL/h via EPIDURAL

## 2015-08-15 MED ORDER — OXYCODONE-ACETAMINOPHEN 5-325 MG PO TABS: 1.0000 | ORAL_TABLET | ORAL | Status: DC | PRN

## 2015-08-15 MED ORDER — ACETAMINOPHEN 325 MG PO TABS: 650.0000 mg | ORAL_TABLET | ORAL | Status: DC | PRN

## 2015-08-15 MED ORDER — ONDANSETRON HCL 4 MG/2ML IJ SOLN: 4.0000 mg | Freq: Four times a day (QID) | INTRAMUSCULAR | Status: DC | PRN

## 2015-08-15 MED ORDER — OXYCODONE-ACETAMINOPHEN 5-325 MG PO TABS: 2.0000 | ORAL_TABLET | ORAL | Status: DC | PRN

## 2015-08-15 MED ORDER — CALCIUM CARBONATE ANTACID 500 MG PO CHEW: 2.0000 | CHEWABLE_TABLET | ORAL | Status: DC | PRN

## 2015-08-15 MED ORDER — AZITHROMYCIN 500 MG PO TABS: 500.0000 mg | ORAL_TABLET | Freq: Every day | ORAL | Status: DC

## 2015-08-15 MED ORDER — LACTATED RINGERS IV SOLN: 500.0000 mL | Freq: Once | INTRAVENOUS | Status: DC

## 2015-08-15 MED ORDER — PHENYLEPHRINE 40 MCG/ML (10ML) SYRINGE FOR IV PUSH (FOR BLOOD PRESSURE SUPPORT)
80.0000 ug | PREFILLED_SYRINGE | INTRAVENOUS | Status: DC | PRN
  Filled 2015-08-15: qty 5

## 2015-08-15 MED ORDER — SODIUM CHLORIDE 0.9% FLUSH: 3.0000 mL | Freq: Two times a day (BID) | INTRAVENOUS | Status: DC

## 2015-08-15 MED ORDER — LIDOCAINE HCL (PF) 1 % IJ SOLN
30.0000 mL | INTRAMUSCULAR | Status: DC | PRN
  Filled 2015-08-15: qty 30

## 2015-08-15 MED ORDER — OXYTOCIN 40 UNITS IN LACTATED RINGERS INFUSION - SIMPLE MED
2.5000 [IU]/h | INTRAVENOUS | Status: DC
  Filled 2015-08-15: qty 1000

## 2015-08-15 MED ORDER — MAGNESIUM SULFATE 50 % IJ SOLN
2.0000 g/h | INTRAVENOUS | Status: DC
  Filled 2015-08-15: qty 80

## 2015-08-15 MED ORDER — DOCUSATE SODIUM 100 MG PO CAPS
100.0000 mg | ORAL_CAPSULE | Freq: Every day | ORAL | Status: DC
  Administered 2015-08-15: 100 mg via ORAL
  Filled 2015-08-15 (×2): qty 1

## 2015-08-15 MED ORDER — SODIUM CHLORIDE 0.9 % IV SOLN
2.0000 g | Freq: Four times a day (QID) | INTRAVENOUS | Status: DC
  Administered 2015-08-15 (×3): 2 g via INTRAVENOUS
  Filled 2015-08-15 (×6): qty 2000

## 2015-08-15 MED ORDER — FLEET ENEMA 7-19 GM/118ML RE ENEM: 1.0000 | ENEMA | RECTAL | Status: DC | PRN

## 2015-08-15 MED ORDER — ZOLPIDEM TARTRATE 5 MG PO TABS
5.0000 mg | ORAL_TABLET | Freq: Every evening | ORAL | Status: DC | PRN
Start: 2015-08-15 — End: 2015-08-15
  Administered 2015-08-15: 5 mg via ORAL
  Filled 2015-08-15 (×2): qty 1

## 2015-08-15 MED ORDER — FENTANYL 2.5 MCG/ML BUPIVACAINE 1/10 % EPIDURAL INFUSION (WH - ANES)
INTRAMUSCULAR | Status: AC
  Filled 2015-08-15: qty 125

## 2015-08-15 MED ORDER — LACTATED RINGERS IV SOLN: 500.0000 mL | INTRAVENOUS | Status: DC | PRN

## 2015-08-15 MED ORDER — PHENYLEPHRINE 40 MCG/ML (10ML) SYRINGE FOR IV PUSH (FOR BLOOD PRESSURE SUPPORT)
PREFILLED_SYRINGE | INTRAVENOUS | Status: AC
  Filled 2015-08-15: qty 20

## 2015-08-15 MED ORDER — GLYBURIDE 2.5 MG PO TABS
2.5000 mg | ORAL_TABLET | Freq: Every day | ORAL | Status: DC
  Administered 2015-08-15: 2.5 mg via ORAL
  Filled 2015-08-15: qty 1

## 2015-08-15 MED ORDER — SOD CITRATE-CITRIC ACID 500-334 MG/5ML PO SOLN: 30.0000 mL | ORAL | Status: DC | PRN

## 2015-08-15 MED ORDER — BETAMETHASONE SOD PHOS & ACET 6 (3-3) MG/ML IJ SUSP
12.0000 mg | INTRAMUSCULAR | Status: DC
  Administered 2015-08-15: 12 mg via INTRAMUSCULAR
  Filled 2015-08-15 (×2): qty 2

## 2015-08-15 MED ORDER — PRENATAL MULTIVITAMIN CH
1.0000 | ORAL_TABLET | Freq: Every day | ORAL | Status: DC
  Administered 2015-08-15: 1 via ORAL
  Filled 2015-08-15 (×2): qty 1

## 2015-08-15 MED ORDER — DIPHENHYDRAMINE HCL 50 MG/ML IJ SOLN: 12.5000 mg | INTRAMUSCULAR | Status: DC | PRN

## 2015-08-15 MED ORDER — BUTORPHANOL TARTRATE 1 MG/ML IJ SOLN
INTRAMUSCULAR | Status: AC
  Administered 2015-08-15: 1 mg
  Filled 2015-08-15: qty 1

## 2015-08-15 MED ORDER — BUTORPHANOL TARTRATE 1 MG/ML IJ SOLN: 1.0000 mg | Freq: Once | INTRAMUSCULAR | Status: DC

## 2015-08-15 MED ORDER — EPHEDRINE 5 MG/ML INJ
10.0000 mg | INTRAVENOUS | Status: DC | PRN
  Filled 2015-08-15: qty 4

## 2015-08-15 MED ORDER — LACTATED RINGERS IV SOLN
INTRAVENOUS | Status: DC
  Administered 2015-08-15: 07:00:00 via INTRAVENOUS

## 2015-08-15 MED ORDER — OXYTOCIN BOLUS FROM INFUSION
500.0000 mL | Freq: Once | INTRAVENOUS | Status: AC
  Administered 2015-08-16: 500 mL via INTRAVENOUS

## 2015-08-15 MED ORDER — MAGNESIUM SULFATE BOLUS VIA INFUSION
6.0000 g | Freq: Once | INTRAVENOUS | Status: AC
  Administered 2015-08-15: 6 g via INTRAVENOUS
  Filled 2015-08-15: qty 500

## 2015-08-15 NOTE — Anesthesia Preprocedure Evaluation (Signed)
Anesthesia Evaluation  Patient identified by MRN, date of birth, ID band Patient awake    Reviewed: Allergy & Precautions, NPO status , Patient's Chart, lab work & pertinent test results  History of Anesthesia Complications Negative for: history of anesthetic complications  Airway Mallampati: II  TM Distance: >3 FB Neck ROM: Full    Dental no notable dental hx. (+) Dental Advisory Given   Pulmonary former smoker,    Pulmonary exam normal breath sounds clear to auscultation       Cardiovascular hypertension (gestational), Normal cardiovascular exam Rhythm:Regular Rate:Normal     Neuro/Psych  Headaches, negative psych ROS   GI/Hepatic negative GI ROS, Neg liver ROS,   Endo/Other  diabetes, GestationalMorbid obesity  Renal/GU negative Renal ROS  negative genitourinary   Musculoskeletal negative musculoskeletal ROS (+)   Abdominal   Peds negative pediatric ROS (+)  Hematology negative hematology ROS (+)   Anesthesia Other Findings   Reproductive/Obstetrics (+) Pregnancy                             Anesthesia Physical Anesthesia Plan  ASA: III  Anesthesia Plan: Epidural   Post-op Pain Management:    Induction:   Airway Management Planned:   Additional Equipment:   Intra-op Plan:   Post-operative Plan:   Informed Consent: I have reviewed the patients History and Physical, chart, labs and discussed the procedure including the risks, benefits and alternatives for the proposed anesthesia with the patient or authorized representative who has indicated his/her understanding and acceptance.   Dental advisory given  Plan Discussed with: CRNA  Anesthesia Plan Comments:         Anesthesia Quick Evaluation

## 2015-08-15 NOTE — Anesthesia Procedure Notes (Signed)
Epidural Patient location during procedure: OB  Staffing Anesthesiologist: Syna Gad Performed: anesthesiologist   Preanesthetic Checklist Completed: patient identified, site marked, surgical consent, pre-op evaluation, timeout performed, IV checked, risks and benefits discussed and monitors and equipment checked  Epidural Patient position: sitting Prep: site prepped and draped and DuraPrep Patient monitoring: continuous pulse ox and blood pressure Approach: midline Location: L3-L4 Injection technique: LOR saline  Needle:  Needle type: Tuohy  Needle gauge: 17 G Needle length: 9 cm and 9 Needle insertion depth: 7 cm Catheter type: closed end flexible Catheter size: 19 Gauge Catheter at skin depth: 12 cm Test dose: negative  Assessment Events: blood not aspirated, injection not painful, no injection resistance, negative IV test and no paresthesia  Additional Notes Patient identified. Risks/Benefits/Options discussed with patient including but not limited to bleeding, infection, nerve damage, paralysis, failed block, incomplete pain control, headache, blood pressure changes, nausea, vomiting, reactions to medication both or allergic, itching and postpartum back pain. Confirmed with bedside nurse the patient's most recent platelet count. Confirmed with patient that they are not currently taking any anticoagulation, have any bleeding history or any family history of bleeding disorders. Patient expressed understanding and wished to proceed. All questions were answered. Sterile technique was used throughout the entire procedure. Please see nursing notes for vital signs. Test dose was given through epidural catheter and negative prior to continuing to dose epidural or start infusion. Warning signs of high block given to the patient including shortness of breath, tingling/numbness in hands, complete motor block, or any concerning symptoms with instructions to call for help. Patient was given  instructions on fall risk and not to get out of bed. All questions and concerns addressed with instructions to call with any issues or inadequate analgesia.        

## 2015-08-15 NOTE — Progress Notes (Signed)
Called due to painful and frequent ctx Order given for magnesium sulfate On arrival, pt sitting on edge of bed Breathing with ctx and c/o needing to urinate Exam. abd gravid nontender mild palp ctx VE ft/80/-2 posterior Foley cath placed  IMP; PPROM @ 34 1/7 weeks on Amp/azithromycin IUP @ 34 1/7 weeks s/p BMZ Class A2 GDM starting glyburide now Gest HTN GBS cx neg P) magnesium bolus in progress. Foley inserted/. Cont PPROM antibiotics

## 2015-08-15 NOTE — Progress Notes (Signed)
Called by RN due to advanced dilation despite magnesium sulfate. Pt had been given IV stadol for pain Transferred to L&D for further mgmt Epidural BP (!) 141/94   Pulse (!) 119   Temp 98 F (36.7 C) (Oral)   Resp 20   Ht 5\' 5"  (1.651 m)   Wt 107.6 kg (237 lb 4.8 oz)   LMP 10/04/2014 (Approximate)   SpO2 100%   BMI 39.49 kg/m   VE 8-9 with lip on maternal right/100/0/+1 station CBG (last 3)   Recent Labs  08/15/15 1018 08/15/15 1609 08/15/15 2101  GLUCAP 150* 115* 119*   IMP: PTL after PPROM failed attempted tocolysis IUP @ 34 1/7 weeks Class a2 GDM Gest HTN P) left exaggerated sims . CBG q 2 hrs. Anticipate SVD

## 2015-08-15 NOTE — MAU Note (Signed)
Pt reports ROM at 0540, some back pain.

## 2015-08-15 NOTE — Consult Note (Signed)
[facility]  Prenatal Consult       08/15/2015  12:35 PM  I was asked by Dr. Cherly Hensen to consult on this patient for possible preterm delivery.  I had the pleasure of meeting with Ms. Hirt today.  She is a 30 y/o G1P0 at 65 and 1/[redacted] weeks gestation who was admitted for ROM at 0540 this morning.  Clear fluid with no labor.  Her pregnancy has also been complicated by gestational diabetes which was previously diet controlled but she has now been started on glyburide.  She has been given a dose of BMZ and will receive a 2nd dose tomorrow.  No plans to augment yet but will not stop labor if it begins. She has been started on latency antibiotics.    I explained that the neonatal intensive care team would be present for the delivery and outlined the likely delivery room course for this baby including routine resuscitation and NRP-guided approaches to the treatment of respiratory distress. We discussed other common problems associated with prematurity including respiratory distress syndrome/CLD, apnea, feeding issues, temperature regulation, and infection risk.  We briefly discussed IVH/PVL, ROP, and NEC and that these are complications associated with prematurity, but that by 30 weeks are uncommon.    We discussed the average length of stay but I noted that the actual LOS would depend on the severity of problems encountered and response to treatments.  We discussed visitation policies and the resources available while her child is in the hospital.  We discussed the importance of good nutrition and various methods of providing nutrition (parenteral hyperalimentation, gavage feedings and/or oral feeding). We discussed the benefits of human milk. I encouraged breast feeding and pumping soon after birth and outlined resources that are available to support breast feeding.   Thank you for involving [place] in the care of this patient. A member of our team will be available should the family have  additional questions.  Time for consultation approximately 45 minutes.   _____________________ Electronically Signed By: Maryan Char, MD Neonatologist

## 2015-08-15 NOTE — H&P (Signed)
Samantha Gould is a 30 y.o. female G1P0 MBF @ 34 1/[redacted] weeks gestation  presenting with premature rupture of membrane @ 5:40 pm clear fluid. Good FM. Class A1 GDM. Last sono ( 7/19) 5lb 3 oz( 75%) nl fluid  OB History    Gravida Para Term Preterm AB Living   1             SAB TAB Ectopic Multiple Live Births                 Past Medical History:  Diagnosis Date  . Currently pregnant 01/2011  . Gestational diabetes   . Hypertension    with pregnancy only  . Migraine    Past Surgical History:  Procedure Laterality Date  . NO PAST SURGERIES     Family History: family history includes Breast cancer in her maternal grandmother; Diabetes in her maternal grandmother and mother; Migraines in her sister; Sleep apnea in her father and maternal grandfather. Social History:  reports that she quit smoking about 1 years ago. She has never used smokeless tobacco. She reports that she does not drink alcohol or use drugs.     Maternal Diabetes: Yes:  Diabetes Type:  Diet controlled Genetic Screening: Normal ( informaseq nl) Maternal Ultrasounds/Referrals: Abnormal:  Findings:   Isolated EIF (echogenic intracardiac focus), Absent nasal bone Fetal Ultrasounds or other Referrals:  None Maternal Substance Abuse:  No Significant Maternal Medications:  None Significant Maternal Lab Results:  Lab values include: Group B Strep negative Other Comments:  gestational HTN, Class A1 GDM with fasting BS  Review of Systems  All other systems reviewed and are negative.  History   Blood pressure 142/92, pulse 99, temperature 98.7 F (37.1 C), temperature source Oral, resp. rate 20, last menstrual period 10/04/2014, SpO2 100 %. Exam Physical Exam  Constitutional: She is oriented to person, place, and time. She appears well-developed and well-nourished.  Eyes: EOM are normal.  Neck: Neck supple.  Cardiovascular: Regular rhythm.   Respiratory: Breath sounds normal.  GI: Soft.  Neurological: She is alert  and oriented to person, place, and time.  Skin: Skin is warm and dry.  Psychiatric: She has a normal mood and affect.   vulva: nl Vagina: nl Cervix FT/thick/-3 per RN  Prenatal labs: ABO, Rh:   Antibody:  negative Rubella:  Immune RPR:   NR HBsAg:   neg HIV:   neg GBS:   pend  Tracing: baseline 130 (+) accel to 150 no ctx  Assessment/Plan: PPROM IUP@ 34 1/[redacted] weeks gestation Class A1 GDM Gestational HTN  P) admit. PIH labs. BMZ. Start glyburide for persistent fasting glucose, NICU consult PPROM  prophylaxis antibiotics. GBS cx today. Continuous monitoring   Samantha Gould A 08/15/2015, 6:38 AM

## 2015-08-16 ENCOUNTER — Encounter (HOSPITAL_COMMUNITY): Payer: Self-pay | Admitting: *Deleted

## 2015-08-16 LAB — CBC
HEMATOCRIT: 34.8 % — AB (ref 36.0–46.0)
Hemoglobin: 12.2 g/dL (ref 12.0–15.0)
MCH: 30.9 pg (ref 26.0–34.0)
MCHC: 35.1 g/dL (ref 30.0–36.0)
MCV: 88.1 fL (ref 78.0–100.0)
Platelets: 233 10*3/uL (ref 150–400)
RBC: 3.95 MIL/uL (ref 3.87–5.11)
RDW: 13.6 % (ref 11.5–15.5)
WBC: 15.7 10*3/uL — ABNORMAL HIGH (ref 4.0–10.5)

## 2015-08-16 LAB — GLUCOSE, CAPILLARY: GLUCOSE-CAPILLARY: 120 mg/dL — AB (ref 65–99)

## 2015-08-16 LAB — LDL CHOLESTEROL, DIRECT: LDL DIRECT: 140 mg/dL — AB (ref 0–99)

## 2015-08-16 MED ORDER — FLUTICASONE PROPIONATE 50 MCG/ACT NA SUSP
2.0000 | Freq: Every day | NASAL | Status: DC
  Administered 2015-08-16 – 2015-08-17 (×2): 2 via NASAL
  Filled 2015-08-16: qty 16

## 2015-08-16 MED ORDER — SIMETHICONE 80 MG PO CHEW: 80.0000 mg | CHEWABLE_TABLET | ORAL | Status: DC | PRN

## 2015-08-16 MED ORDER — SENNOSIDES-DOCUSATE SODIUM 8.6-50 MG PO TABS
2.0000 | ORAL_TABLET | ORAL | Status: DC
  Administered 2015-08-16 – 2015-08-17 (×2): 2 via ORAL
  Filled 2015-08-16 (×2): qty 2

## 2015-08-16 MED ORDER — ONDANSETRON HCL 4 MG PO TABS: 4.0000 mg | ORAL_TABLET | ORAL | Status: DC | PRN

## 2015-08-16 MED ORDER — WITCH HAZEL-GLYCERIN EX PADS: 1.0000 "application " | MEDICATED_PAD | CUTANEOUS | Status: DC | PRN

## 2015-08-16 MED ORDER — PRENATAL MULTIVITAMIN CH
1.0000 | ORAL_TABLET | Freq: Every day | ORAL | Status: DC
  Administered 2015-08-16 – 2015-08-17 (×2): 1 via ORAL
  Filled 2015-08-16 (×2): qty 1

## 2015-08-16 MED ORDER — COCONUT OIL OIL
1.0000 | TOPICAL_OIL | Status: DC | PRN
Start: 2015-08-16 — End: 2015-08-18
  Administered 2015-08-16: 1 via TOPICAL
  Filled 2015-08-16: qty 120

## 2015-08-16 MED ORDER — ONDANSETRON HCL 4 MG/2ML IJ SOLN: 4.0000 mg | INTRAMUSCULAR | Status: DC | PRN

## 2015-08-16 MED ORDER — ZOLPIDEM TARTRATE 5 MG PO TABS: 5.0000 mg | ORAL_TABLET | Freq: Every evening | ORAL | Status: DC | PRN

## 2015-08-16 MED ORDER — ACETAMINOPHEN 325 MG PO TABS
650.0000 mg | ORAL_TABLET | ORAL | Status: DC | PRN
  Administered 2015-08-16: 650 mg via ORAL
  Filled 2015-08-16 (×2): qty 2

## 2015-08-16 MED ORDER — IBUPROFEN 600 MG PO TABS
600.0000 mg | ORAL_TABLET | Freq: Four times a day (QID) | ORAL | Status: DC
  Administered 2015-08-16 – 2015-08-18 (×9): 600 mg via ORAL
  Filled 2015-08-16 (×10): qty 1

## 2015-08-16 MED ORDER — DIPHENHYDRAMINE HCL 25 MG PO CAPS: 25.0000 mg | ORAL_CAPSULE | Freq: Four times a day (QID) | ORAL | Status: DC | PRN

## 2015-08-16 MED ORDER — BENZOCAINE-MENTHOL 20-0.5 % EX AERO: 1.0000 "application " | INHALATION_SPRAY | CUTANEOUS | Status: DC | PRN

## 2015-08-16 MED ORDER — DIBUCAINE 1 % RE OINT: 1.0000 "application " | TOPICAL_OINTMENT | RECTAL | Status: DC | PRN

## 2015-08-16 MED ORDER — FERROUS SULFATE 325 (65 FE) MG PO TABS
325.0000 mg | ORAL_TABLET | Freq: Two times a day (BID) | ORAL | Status: DC
  Administered 2015-08-16 – 2015-08-18 (×4): 325 mg via ORAL
  Filled 2015-08-16 (×6): qty 1

## 2015-08-16 NOTE — Anesthesia Postprocedure Evaluation (Signed)
Anesthesia Post Note  Patient: Teianna Pressly  Procedure(s) Performed: * No procedures listed *  Patient location during evaluation: Mother Baby Anesthesia Type: Epidural Level of consciousness: awake and alert and oriented Pain management: satisfactory to patient Vital Signs Assessment: post-procedure vital signs reviewed and stable Respiratory status: spontaneous breathing and nonlabored ventilation Cardiovascular status: stable Postop Assessment: no headache, no backache, no signs of nausea or vomiting, adequate PO intake and patient able to bend at knees (patient up walking) Anesthetic complications: no     Last Vitals:  Vitals:   08/16/15 0304 08/16/15 0409  BP: (!) 143/88 123/72  Pulse: (!) 112 (!) 111  Resp: 20 20  Temp: 36.7 C 36.3 C    Last Pain:  Vitals:   08/16/15 0415  TempSrc:   PainSc: 0-No pain   Pain Goal: Patients Stated Pain Goal: 0 (08/16/15 0415)               Madison Hickman

## 2015-08-16 NOTE — Lactation Note (Signed)
This note was copied from a baby's chart. Lactation Consultation Note  Patient Name: Samantha Gould [id number] Date: 08/16/2015 Reason for consult: Initial assessment;NICU baby  NICU baby 35 hours old. Mom is pumping when this LC entered the room. Fitted mom with a #27 flange, and mom reported increased comfort. Assisted mom to collect a couple of drops of colostrum, and enc mom to take to NICU for baby. Discussed EBM storage guidelines. Mom given [facility] brochure and NICU booklet with review. Mom states that she will be getting a personal pump. Discussed the benefits of a hospital-grade DEBP, and mom given paperwork for a 2-week rental pump. Enc mom to continue pumping every 2-3 hours for a total of 8 times/24 hours. Enc mom to hand express after pumping. Discussed progression of milk coming to volume, and enc mom to offer lots of STS and nuzzling in the NICU as she and baby able. Enc mom to have pictures of baby on her phone while pumping as well. Mom aware of OP/BFSG and LC phone line assistance after D/C. Maternal Data Has patient been taught Hand Expression?: Yes Does the patient have breastfeeding experience prior to this delivery?: No  Feeding    LATCH Score/Interventions                      Lactation Tools Discussed/Used WIC Program: No Pump Review: Setup, frequency, and cleaning;Milk Storage Initiated by:: bedside nurse Date initiated:: 08/16/15   Consult Status Consult Status: Follow-up Date: 08/17/15 Follow-up type: In-patient    Geralynn Ochs 08/16/2015, 3:34 PM

## 2015-08-17 LAB — CBC
HEMATOCRIT: 32.5 % — AB (ref 36.0–46.0)
Hemoglobin: 11 g/dL — ABNORMAL LOW (ref 12.0–15.0)
MCH: 30.6 pg (ref 26.0–34.0)
MCHC: 33.8 g/dL (ref 30.0–36.0)
MCV: 90.3 fL (ref 78.0–100.0)
Platelets: 236 10*3/uL (ref 150–400)
RBC: 3.6 MIL/uL — ABNORMAL LOW (ref 3.87–5.11)
RDW: 13.9 % (ref 11.5–15.5)
WBC: 11.6 10*3/uL — ABNORMAL HIGH (ref 4.0–10.5)

## 2015-08-17 NOTE — Progress Notes (Signed)
Progress Notes Date of Service: 08/17/2015 9:37 AM Barbara Cower, LCSW  CASE MANAGEMENT      Patient screened out for psychosocial assessment since none of the following apply: Psychosocial stressors documented in mother or baby's chart Gestation less than 32 weeks Code at delivery  Infant with anomalies Please contact the Clinical Social Worker if specific needs arise, or by MOB's request.  Blaine Hamper, LCSW, MSW  [phone]

## 2015-08-17 NOTE — Progress Notes (Signed)
Initial visit with Samantha Gould and Samantha Gould and Gould to introduce spiritual care services and offer support during Samantha son Samantha Gould's NICU stay.  Samantha Gould shared that she is feeling good, but really tired.  She was surprised by Samantha water breaking last week, 6 weeks early, right after Samantha husband had left for work, but is grateful that Samantha parents had just arrived to help Samantha get some things in order.  She shared that Samantha Gould's birth was painful and not the way she expected, but states she is doing okay with everything.  She is anticipating going home tomorrow and is a bit nervous about that, but knows she'll come right back to see the baby.  I normalized the feelings involved with a NICU stay and reminded Samantha of the support available through our spiritual care team, Education Center classes, and Family Support Network.  Please page as further needs arise.  Samantha Gould. Samantha Gould, M.Div. [facility] Chaplain Pager [phone] Office [phone]     08/17/15 1451  Clinical Encounter Type  Visited With Patient and family together  Visit Type Initial;Psychological support  Referral From Chaplain  Consult/Referral To Chaplain  Stress Factors  Patient Stress Factors Loss of control;Major life changes

## 2015-08-17 NOTE — Progress Notes (Signed)
Patient ID: Samantha Gould, female   DOB: 1985-12-31, 30 y.o.   MRN: [id number] PPD # 1  Subjective: Pt reports feeling well / Pain controlled with ibuprofen Tolerating po/ Voiding without problems/ No n/v Bleeding is light Newborn info:  Information for the patient's newborn:  Samantha, Gould [[id number]]  female Infant stable in NICU  / circ deferred until improved status / Feeding: breast   Objective:  VS: Blood pressure (!) 142/93, pulse 81, temperature 98.4 F (36.9 C), temperature source Oral, resp. rate 18, height 5\' 5"  (1.651 m), weight 107.6 kg (237 lb 4.8 oz), last menstrual period 10/04/2014, SpO2 99 %, unknown if currently breastfeeding.    Recent Labs  08/16/15 0214 08/17/15 0508  WBC 15.7* 11.6*  HGB 12.2 11.0*  HCT 34.8* 32.5*  PLT 233 236    Blood type: A POS Rubella: Immune (02/07 0000)    Physical Exam:  General: alert, cooperative and no distress CV: Regular rate and rhythm Resp: clear Abdomen: soft, nontender, normal bowel sounds Uterine Fundus: firm, below umbilicus, nontender Perineum: healing with good reapproximation Lochia: moderate Ext: edema trace and Homans sign is negative, no sign of DVT   A/P: PPD # 1/ G1P0101/ S/P: PROM @ 34wks with SVD w/ 2nd deg lac Hx GDM A1 Hx gest HTN  BP 142/93 early am  Will add VS Q 4 hrs until MN  No symptoms of PEC  Doing well Continue routine post partum orders Anticipate D/C home in AM    Samantha Revel, MSN, [facility] 08/17/2015, 11:50 AM

## 2015-08-18 MED ORDER — IBUPROFEN 600 MG PO TABS: 600.0000 mg | ORAL_TABLET | Freq: Four times a day (QID) | ORAL | 0 refills | Status: DC

## 2015-08-18 MED ORDER — FERROUS SULFATE 325 (65 FE) MG PO TABS: 325.0000 mg | ORAL_TABLET | Freq: Two times a day (BID) | ORAL | 3 refills | Status: DC

## 2015-08-18 NOTE — Progress Notes (Signed)
Patient ID: Tomara Lash, female   DOB: Sep 28, 1985, 30 y.o.   MRN: [id number] Post Partum Day #2            Information for the patient's newborn:  Merina, Miyoshi [[id number]]  Female in the NICU   / circumcision planning prior to d/c from NICU Feeding: bottle  Subjective: No HA, SOB, CP, F/C, breast symptoms. Pain well-managed with ibuprofen. Normal vaginal bleeding, no clots.      Objective:  Temp:  [98 F (36.7 C)-98.8 F (37.1 C)] 98.8 F (37.1 C) (07/28 0918) Pulse Rate:  [67-101] 67 (07/28 0918) Resp:  [14-20] 18 (07/28 0918) BP: (125-140)/(68-95) 132/89 (07/28 0918) SpO2:  [98 %-100 %] 100 % (07/28 0918) Weight:  [102.1 kg (225 lb 1.3 oz)] 102.1 kg (225 lb 1.3 oz) (07/28 0515)  No intake or output data in the 24 hours ending 08/18/15 1209     Recent Labs  08/16/15 0214 08/17/15 0508  WBC 15.7* 11.6*  HGB 12.2 11.0*  HCT 34.8* 32.5*  PLT 233 236    Blood type: A POS (07/25 2376) Rubella: Immune (02/07 0000)    Physical Exam:  General: alert, cooperative and no distress Uterine Fundus: firm, midline, U-2 Lochia: appropriate Perineum: 2nd degree repair healing well, edema none DVT Evaluation: No evidence of DVT seen on physical exam. Negative Homan's sign. No cords or calf tenderness. No significant calf/ankle edema.    Assessment/Plan: PPD # 2 / 30 y.o., G1P0101 S/P: spontaneous preterm vaginal   Principal Problem:   Postpartum care following vaginal delivery (7/26) Active Problems:   Preterm premature rupture of membranes   Indication for care in labor or delivery   normal postpartum exam  Continue current postpartum care  D/C home  BP recheck at [facility] in 1 wk  PP visit with Dr. Cherly Hensen in 6 wks   LOS: 3 days   Kenard Gower, MSN, CNM 08/18/2015, 12:09 PM

## 2015-08-18 NOTE — Discharge Summary (Signed)
POSTPARTUM DISCHARGE SUMMARY:  Patient ID: Samantha Gould MRN: [id number] DOB/AGE: Mar 13, 1985 30 y.o.  Admit date: 08/15/2015 Admission Diagnoses:  Preterm premature rupture of membrane @ 5:40 pm clear fluid. Good FM. Class A1 GDM. Last sono (7/19) 5lb 3 oz (75%) nl fluid, IUP @ 34 1/7 weeks, Gestational HTN   Discharge date:  08/18/2015 Discharge Diagnoses: PPROM, PTB @ 34 2/7 weeks, Class A2 GDM, , Gestational HTN  Prenatal history: G1P0101   EDC : 09/25/2015, by LMP Prenatal care at [facility]  Primary provider : Dr. Cherly Hensen Prenatal course complicated by gestational HTN, Class A1 GDM with elevated  fasting BS  Prenatal Labs: ABO, Rh: A POS (07/25 0653)  Antibody: NEG (07/25 0653) Rubella: Immune (02/07 0000)   RPR: Non Reactive (07/25 0653)  HBsAg: Negative (02/07 0000)  HIV: Non-reactive (02/07 0000)  GTT : Abnormal 1hr & 3hr GTTs GBS: Negative (07/25 0000)   Medical / Surgical History :  Past medical history:  Past Medical History:  Diagnosis Date  . Currently pregnant 01/2011  . Gestational diabetes   . Hypertension    with pregnancy only  . Migraine    migraines; Exedrine Migraine works    Past surgical history:  Past Surgical History:  Procedure Laterality Date  . NO PAST SURGERIES      Family History:  Family History  Problem Relation Age of Onset  . Breast cancer Maternal Grandmother   . Diabetes Maternal Grandmother   . Sleep apnea Father   . Sleep apnea Maternal Grandfather   . Diabetes Mother   . Migraines Sister     Social History:  reports that she quit smoking about 1 years ago. She has never used smokeless tobacco. She reports that she does not drink alcohol or use drugs.  Allergies: Review of patient's allergies indicates no known allergies.   Current Medications at time of admission:  Prior to Admission medications   Medication Sig Start Date End Date Taking? Authorizing Provider  acetaminophen (TYLENOL) 500 MG tablet  Take 1,000 mg by mouth every 6 (six) hours as needed for moderate pain.   Yes Historical Provider, MD  calcium carbonate (TUMS - DOSED IN MG ELEMENTAL CALCIUM) 500 MG chewable tablet Chew 2 tablets by mouth 3 (three) times daily as needed for indigestion or heartburn.   Yes Historical Provider, MD  fluticasone (FLONASE) 50 MCG/ACT nasal spray Place 2 sprays into both nostrils daily.   Yes Historical Provider, MD  Prenatal Vit-Fe Fumarate-FA (PRENATAL MULTIVITAMIN) TABS tablet Take 1 tablet by mouth daily at 12 noon.   Yes Historical Provider, MD  ferrous sulfate 325 (65 FE) MG tablet Take 1 tablet (325 mg total) by mouth 2 (two) times daily with a meal. 08/18/15   Raelyn Mora, CNM  ibuprofen (ADVIL,MOTRIN) 600 MG tablet Take 1 tablet (600 mg total) by mouth every 6 (six) hours. 08/18/15   Raelyn Mora, CNM    Intrapartum Course:  Admitted for PPROM  to antenatal unit / IV abx for PPROM tx / MgSO4 bolus for tocolysis , BMZ x 1 / consultation with neonatologist / GBS cx neg / rapid labor progression to 7/100/0 dilation despite MgSO4 / transferred to L&D to prepare for preterm delivery / spontaneous vaginal delivery on 08/16/2015 with delivery of viable female newborn with repair of 2nd degree perineal laceration by Dr Cherly Hensen  Pain management: Stadol & epidural Complicated by: PPROM / PTL / PTD Interventions required: none  APGAR (1 MIN): 8   APGAR (5  MINS): 9    Postpartum course:  Complicated with elevated BPs discharge on PPD 2  Discharge Instructions:  Discharged Condition: stable  Activity: pelvic rest x 6 weeks   Diet: routine  Medications:    Medication List    TAKE these medications   acetaminophen 500 MG tablet Commonly known as:  TYLENOL Take 1,000 mg by mouth every 6 (six) hours as needed for moderate pain.   calcium carbonate 500 MG chewable tablet Commonly known as:  TUMS - dosed in mg elemental calcium Chew 2 tablets by mouth 3 (three) times daily as needed for  indigestion or heartburn.   ferrous sulfate 325 (65 FE) MG tablet Take 1 tablet (325 mg total) by mouth 2 (two) times daily with a meal.   fluticasone 50 MCG/ACT nasal spray Commonly known as:  FLONASE Place 2 sprays into both nostrils daily.   ibuprofen 600 MG tablet Commonly known as:  ADVIL,MOTRIN Take 1 tablet (600 mg total) by mouth every 6 (six) hours.   prenatal multivitamin Tabs tablet Take 1 tablet by mouth daily at 12 noon.       Postpartum Instructions: [facility] discharge booklet - instructions reviewed  Discharge to: Home  Follow up :  [facility] in 1 for interval visit with CNM or NP for BP recheck [facility] in 6 weeks for routine postpartum visit with Dr. Cherly Hensen                Signed: Raelyn Mora, M MSN, CNM 08/18/2015, 12:36 PM

## 2015-08-18 NOTE — Discharge Instructions (Signed)
Breast Pumping Tips °If you are breastfeeding, there may be times when you cannot feed your baby directly. Returning to work or going on a trip are common examples. Pumping allows you to store breast milk and feed it to your baby later.  °You may not get much milk when you first start to pump. Your breasts should start to make more after a few days. If you pump at the times you usually feed your baby, you may be able to keep making enough milk to feed your baby without also using formula. The more often you pump, the more milk you will produce.  °WHEN SHOULD I PUMP?  °· You can begin to pump soon after delivery. However, some experts recommend waiting about 4 weeks before giving your infant a bottle to make sure breastfeeding is going well.  °· If you plan to return to work, begin pumping a few weeks before. This will help you develop techniques that work best for you. It also lets you build up a supply of breast milk.   °· When you are with your infant, feed on demand and pump after each feeding.   °· When you are away from your infant for several hours, pump for about 15 minutes every 2-3 hours. Pump both breasts at the same time if you can.   °· If your infant has a formula feeding, make sure to pump around the same time.     °· If you drink any alcohol, wait 2 hours before pumping.   °HOW DO I PREPARE TO PUMP? °Your let-down reflex is the natural reaction to stimulation that makes your breast milk flow. It is easier to stimulate this reflex when you are relaxed. Find relaxation techniques that work for you. If you have difficulty with your let-down reflex, try these methods:  °· Smell one of your infant's blankets or an item of clothing.   °· Look at a picture or video of your infant.   °· Sit in a quiet, private space.   °· Massage the breast you plan to pump.   °· Place soothing warmth on the breast.   °· Play relaxing music.   °WHAT ARE SOME GENERAL BREAST PUMPING TIPS? °· Wash your hands before you pump. You  do not need to wash your nipples or breasts. °· There are three ways to pump. °¨ You can use your hand to massage and compress your breast. °¨ You can use a handheld manual pump. °¨ You can use an electric pump.   °· Make sure the suction cup (flange) on the breast pump is the right size. Place the flange directly over the nipple. If it is the wrong size or placed the wrong way, it may be painful and cause nipple damage.   °· If pumping is uncomfortable, apply a small amount of purified or modified lanolin to your nipple and areola. °· If you are using an electric pump, adjust the speed and suction power to be more comfortable. °· If pumping is painful or if you are not getting very much milk, you may need a different type of pump. A lactation consultant can help you determine what type of pump to use.   °· Keep a full water bottle near you at all times. Drinking lots of fluid helps you make more milk.  °· You can store your milk to use later. Pumped breast milk can be stored in a sealable, sterile container or plastic bag. Label all stored breast milk with the date you pumped it. °¨ Milk can stay out at room temperature for up to 8 hours. °¨   You can store your milk in the refrigerator for up to 8 days. °¨ You can store your milk in the freezer for 3 months. Thaw frozen milk using warm water. Do not put it in the microwave. °· Do not smoke. Smoking can lower your milk supply and harm your infant. If you need help quitting, ask your health care provider to recommend a program.   °WHEN SHOULD I CALL MY HEALTH CARE PROVIDER OR A LACTATION CONSULTANT? °· You are having trouble pumping. °· You are concerned that you are not making enough milk. °· You have nipple pain, soreness, or redness. °· You want to use birth control. Birth control pills may lower your milk supply. Talk to your health care provider about your options. °  °This information is not intended to replace advice given to you by your health care provider.  Make sure you discuss any questions you have with your health care provider. °  °Document Released: 06/27/2009 Document Revised: 01/12/2013 Document Reviewed: 10/30/2012 °[employer]vier Interactive Patient Education ©2016 [employer] °Postpartum Depression and Baby Blues °The postpartum period begins right after the birth of a baby. During this time, there is often a great amount of joy and excitement. It is also a time of many changes in the life of the parents. Regardless of how many times a mother gives birth, each child brings new challenges and dynamics to the family. It is not unusual to have feelings of excitement along with confusing shifts in moods, emotions, and thoughts. All mothers are at risk of developing postpartum depression or the "baby blues." These mood changes can occur right after giving birth, or they may occur many months after giving birth. The baby blues or postpartum depression can be mild or severe. Additionally, postpartum depression can go away rather quickly, or it can be a long-term condition.  °CAUSES °Raised hormone levels and the rapid drop in those levels are thought to be a main cause of postpartum depression and the baby blues. A number of hormones change during and after pregnancy. Estrogen and progesterone usually decrease right after the delivery of your baby. The levels of thyroid hormone and various cortisol steroids also rapidly drop. Other factors that play a role in these mood changes include major life events and genetics.  °RISK FACTORS °If you have any of the following risks for the baby blues or postpartum depression, know what symptoms to watch out for during the postpartum period. Risk factors that may increase the likelihood of getting the baby blues or postpartum depression include: °· Having a personal or family history of depression.   °· Having depression while being pregnant.   °· Having premenstrual mood issues or mood issues related to oral  contraceptives. °· Having a lot of life stress.   °· Having marital conflict.   °· Lacking a social support network.   °· Having a baby with special needs.   °· Having health problems, such as diabetes.   °SIGNS AND SYMPTOMS °Symptoms of baby blues include: °· Brief changes in mood, such as going from extreme happiness to sadness. °· Decreased concentration.   °· Difficulty sleeping.   °· Crying spells, tearfulness.   °· Irritability.   °· Anxiety.   °Symptoms of postpartum depression typically begin within the first month after giving birth. These symptoms include: °· Difficulty sleeping or excessive sleepiness.   °· Marked weight loss.   °· Agitation.   °· Feelings of worthlessness.   °· Lack of interest in activity or food.   °Postpartum psychosis is a very serious condition and can be dangerous. Fortunately, it is   rare. Displaying any of the following symptoms is cause for immediate medical attention. Symptoms of postpartum psychosis include:  °· Hallucinations and delusions.   °· Bizarre or disorganized behavior.   °· Confusion or disorientation.   °DIAGNOSIS  °A diagnosis is made by an evaluation of your symptoms. There are no medical or lab tests that lead to a diagnosis, but there are various questionnaires that a health care provider may use to identify those with the baby blues, postpartum depression, or psychosis. Often, a screening tool called the Edinburgh Postnatal Depression Scale is used to diagnose depression in the postpartum period.  °TREATMENT °The baby blues usually goes away on its own in 1-2 weeks. Social support is often all that is needed. You will be encouraged to get adequate sleep and rest. Occasionally, you may be given medicines to help you sleep.  °Postpartum depression requires treatment because it can last several months or longer if it is not treated. Treatment may include individual or group therapy, medicine, or both to address any social, physiological, and psychological factors  that may play a role in the depression. Regular exercise, a healthy diet, rest, and social support may also be strongly recommended.  °Postpartum psychosis is more serious and needs treatment right away. Hospitalization is often needed. °HOME CARE INSTRUCTIONS °· Get as much rest as you can. Nap when the baby sleeps.   °· Exercise regularly. Some women find yoga and walking to be beneficial.   °· Eat a balanced and nourishing diet.   °· Do little things that you enjoy. Have a cup of tea, take a bubble bath, read your favorite magazine, or listen to your favorite music. °· Avoid alcohol.   °· Ask for help with household chores, cooking, grocery shopping, or running errands as needed. Do not try to do everything.   °· Talk to people close to you about how you are feeling. Get support from your partner, family members, friends, or other new moms. °· Try to stay positive in how you think. Think about the things you are grateful for.   °· Do not spend a lot of time alone.   °· Only take over-the-counter or prescription medicine as directed by your health care provider. °· Keep all your postpartum appointments.   °· Let your health care provider know if you have any concerns.   °SEEK MEDICAL CARE IF: °You are having a reaction to or problems with your medicine. °SEEK IMMEDIATE MEDICAL CARE IF: °· You have suicidal feelings.   °· You think you may harm the baby or someone else. °MAKE SURE YOU: °· Understand these instructions. °· Will watch your condition. °· Will get help right away if you are not doing well or get worse. °  °This information is not intended to replace advice given to you by your health care provider. Make sure you discuss any questions you have with your health care provider. °  °Document Released: 10/12/2003 Document Revised: 01/12/2013 Document Reviewed: 10/19/2012 °Elsevier Interactive Patient Education ©2016 [employer] °Postpartum Care After Vaginal Delivery °After you deliver your newborn  (postpartum period), the usual stay in the hospital is 24-72 hours. If there were problems with your labor or delivery, or if you have other medical problems, you might be in the hospital longer.  °While you are in the hospital, you will receive help and instructions on how to care for yourself and your newborn during the postpartum period.  °While you are in the hospital: °· Be sure to tell your nurses if you have pain or discomfort, as well as   where you feel the pain and what makes the pain worse. °· If you had an incision made near your vagina (episiotomy) or if you had some tearing during delivery, the nurses may put ice packs on your episiotomy or tear. The ice packs may help to reduce the pain and swelling. °· If you are breastfeeding, you may feel uncomfortable contractions of your uterus for a couple of weeks. This is normal. The contractions help your uterus get back to normal size. °· It is normal to have some bleeding after delivery. °¨ For the first 1-3 days after delivery, the flow is red and the amount may be similar to a period. °¨ It is common for the flow to start and stop. °¨ In the first few days, you may pass some small clots. Let your nurses know if you begin to pass large clots or your flow increases. °¨ Do not  flush blood clots down the toilet before having the nurse look at them. °¨ During the next 3-10 days after delivery, your flow should become more watery and pink or brown-tinged in color. °¨ Ten to fourteen days after delivery, your flow should be a small amount of yellowish-white discharge. °¨ The amount of your flow will decrease over the first few weeks after delivery. Your flow may stop in 6-8 weeks. Most women have had their flow stop by 12 weeks after delivery. °· You should change your sanitary pads frequently. °· Wash your hands thoroughly with soap and water for at least 20 seconds after changing pads, using the toilet, or before holding or feeding your newborn. °· You should  feel like you need to empty your bladder within the first 6-8 hours after delivery. °· In case you become weak, lightheaded, or faint, call your nurse before you get out of bed for the first time and before you take a shower for the first time. °· Within the first few days after delivery, your breasts may begin to feel tender and full. This is called engorgement. Breast tenderness usually goes away within 48-72 hours after engorgement occurs. You may also notice milk leaking from your breasts. If you are not breastfeeding, do not stimulate your breasts. Breast stimulation can make your breasts produce more milk. °· Spending as much time as possible with your newborn is very important. During this time, you and your newborn can feel close and get to know each other. Having your newborn stay in your room (rooming in) will help to strengthen the bond with your newborn.  It will give you time to get to know your newborn and become comfortable caring for your newborn. °· Your hormones change after delivery. Sometimes the hormone changes can temporarily cause you to feel sad or tearful. These feelings should not last more than a few days. If these feelings last longer than that, you should talk to your caregiver. °· If desired, talk to your caregiver about methods of family planning or contraception. °· Talk to your caregiver about immunizations. Your caregiver may want you to have the following immunizations before leaving the hospital: °¨ Tetanus, diphtheria, and pertussis (Tdap) or tetanus and diphtheria (Td) immunization. It is very important that you and your family (including grandparents) or others caring for your newborn are up-to-date with the Tdap or Td immunizations. The Tdap or Td immunization can help protect your newborn from getting ill. °¨ Rubella immunization. °¨ Varicella (chickenpox) immunization. °¨ Influenza immunization. You should receive this annual immunization if you did not receive the    immunization during your pregnancy. °  °This information is not intended to replace advice given to you by your health care provider. Make sure you discuss any questions you have with your health care provider. °  °Document Released: 11/04/2006 Document Revised: 10/02/2011 Document Reviewed: 09/04/2011 °[employer]vier Interactive Patient Education ©2016 [employer] °Breastfeeding and Mastitis °Mastitis is inflammation of the breast tissue. It can occur in women who are breastfeeding. This can make breastfeeding painful. Mastitis will sometimes go away on its own. Your health care provider will help determine if treatment is needed. °CAUSES °Mastitis is often associated with a blocked milk (lactiferous) duct. This can happen when too much milk builds up in the breast. Causes of excess milk in the breast can include: °· Poor latch-on. If your baby is not latched onto the breast properly, she or he may not empty your breast completely while breastfeeding. °· Allowing too much time to pass between feedings. °· Wearing a bra or other clothing that is too tight. This puts extra pressure on the lactiferous ducts so milk does not flow through them as it should. °Mastitis can also be caused by a bacterial infection. Bacteria may enter the breast tissue through cuts or openings in the skin. In women who are breastfeeding, this may occur because of cracked or irritated skin. Cracks in the skin are often caused when your baby does not latch on properly to the breast. °SIGNS AND SYMPTOMS °· Swelling, redness, tenderness, and pain in an area of the breast. °· Swelling of the glands under the arm on the same side. °· Fever may or may not accompany mastitis. °If an infection is allowed to progress, a collection of pus (abscess) may develop. °DIAGNOSIS  °Your health care provider can usually diagnose mastitis based on your symptoms and a physical exam. Tests may be done to help confirm the diagnosis. These may include: °· Removal of pus  from the breast by applying pressure to the area. This pus can be examined in the lab to determine which bacteria are present. If an abscess has developed, the fluid in the abscess can be removed with a needle. This can also be used to confirm the diagnosis and determine the bacteria present. In most cases, pus will not be present. °· Blood tests to determine if your body is fighting a bacterial infection. °· Mammogram or ultrasound tests to rule out other problems or diseases. °TREATMENT  °Mastitis that occurs with breastfeeding will sometimes go away on its own. Your health care provider may choose to wait 24 hours after first seeing you to decide whether a prescription medicine is needed. If your symptoms are worse after 24 hours, your health care provider will likely prescribe an antibiotic medicine to treat the mastitis. He or she will determine which bacteria are most likely causing the infection and will then select an appropriate antibiotic medicine. This is sometimes changed based on the results of tests performed to identify the bacteria, or if there is no response to the antibiotic medicine selected. Antibiotic medicines are usually given by mouth. You may also be given medicine for pain. °HOME CARE INSTRUCTIONS °· Only take over-the-counter or prescription medicines for pain, fever, or discomfort as directed by your health care provider. °· If your health care provider prescribed an antibiotic medicine, take the medicine as directed. Make sure you finish it even if you start to feel better. °· Do not wear a tight or underwire bra. Wear a soft, supportive bra. °· Increase your fluid   intake, especially if you have a fever. °· Continue to empty the breast. Your health care provider can tell you whether this milk is safe for your infant or needs to be thrown out. You may be told to stop nursing until your health care provider thinks it is safe for your baby. Use a breast pump if you are advised to stop  nursing. °· Keep your nipples clean and dry. °· Empty the first breast completely before going to the other breast. If your baby is not emptying your breasts completely for some reason, use a breast pump to empty your breasts. °· If you go back to work, pump your breasts while at work to stay in time with your nursing schedule. °· Avoid allowing your breasts to become overly filled with milk (engorged). °SEEK MEDICAL CARE IF: °· You have pus-like discharge from the breast. °· Your symptoms do not improve with the treatment prescribed by your health care provider within 2 days. °SEEK IMMEDIATE MEDICAL CARE IF: °· Your pain and swelling are getting worse. °· You have pain that is not controlled with medicine. °· You have a red line extending from the breast toward your armpit. °· You have a fever or persistent symptoms for more than 2-3 days. °· You have a fever and your symptoms suddenly get worse. °MAKE SURE YOU:  °· Understand these instructions. °· Will watch your condition. °· Will get help right away if you are not doing well or get worse. °  °This information is not intended to replace advice given to you by your health care provider. Make sure you discuss any questions you have with your health care provider. °  °Document Released: 05/04/2004 Document Revised: 01/12/2013 Document Reviewed: 08/13/2012 °[employer]vier Interactive Patient Education ©2016 [employer] ° °Breastfeeding °Deciding to breastfeed is one of the best choices you can make for you and your baby. A change in hormones during pregnancy causes your breast tissue to grow and increases the number and size of your milk ducts. These hormones also allow proteins, sugars, and fats from your blood supply to make breast milk in your milk-producing glands. Hormones prevent breast milk from being released before your baby is born as well as prompt milk flow after birth. Once breastfeeding has begun, thoughts of your baby, as well as his or her sucking or  crying, can stimulate the release of milk from your milk-producing glands.  °BENEFITS OF BREASTFEEDING °For Your Baby °· Your first milk (colostrum) helps your baby's digestive system function better. °· There are antibodies in your milk that help your baby fight off infections. °· Your baby has a lower incidence of asthma, allergies, and sudden infant death syndrome. °· The nutrients in breast milk are better for your baby than infant formulas and are designed uniquely for your baby's needs. °· Breast milk improves your baby's brain development. °· Your baby is less likely to develop other conditions, such as childhood obesity, asthma, or type 2 diabetes mellitus. °For You °· Breastfeeding helps to create a very special bond between you and your baby. °· Breastfeeding is convenient. Breast milk is always available at the correct temperature and costs nothing. °· Breastfeeding helps to burn calories and helps you lose the weight gained during pregnancy. °· Breastfeeding makes your uterus contract to its prepregnancy size faster and slows bleeding (lochia) after you give birth.   °· Breastfeeding helps to lower your risk of developing type 2 diabetes mellitus, osteoporosis, and breast or ovarian cancer later in life. °  SIGNS THAT YOUR BABY IS HUNGRY °Early Signs of Hunger °· Increased alertness or activity. °· Stretching. °· Movement of the head from side to side. °· Movement of the head and opening of the mouth when the corner of the mouth or cheek is stroked (rooting). °· Increased sucking sounds, smacking lips, cooing, sighing, or squeaking. °· Hand-to-mouth movements. °· Increased sucking of fingers or hands. °Late Signs of Hunger °· Fussing. °· Intermittent crying. °Extreme Signs of Hunger °Signs of extreme hunger will require calming and consoling before your baby will be able to breastfeed successfully. Do not wait for the following signs of extreme hunger to occur before you initiate  breastfeeding: °· Restlessness. °· A loud, strong cry. °· Screaming. °BREASTFEEDING BASICS °Breastfeeding Initiation °· Find a comfortable place to sit or lie down, with your neck and back well supported. °· Place a pillow or rolled up blanket under your baby to bring him or her to the level of your breast (if you are seated). Nursing pillows are specially designed to help support your arms and your baby while you breastfeed. °· Make sure that your baby's abdomen is facing your abdomen. °· Gently massage your breast. With your fingertips, massage from your chest wall toward your nipple in a circular motion. This encourages milk flow. You may need to continue this action during the feeding if your milk flows slowly. °· Support your breast with 4 fingers underneath and your thumb above your nipple. Make sure your fingers are well away from your nipple and your baby's mouth. °· Stroke your baby's lips gently with your finger or nipple. °· When your baby's mouth is open wide enough, quickly bring your baby to your breast, placing your entire nipple and as much of the colored area around your nipple (areola) as possible into your baby's mouth. °¨ More areola should be visible above your baby's upper lip than below the lower lip. °¨ Your baby's tongue should be between his or her lower gum and your breast. °· Ensure that your baby's mouth is correctly positioned around your nipple (latched). Your baby's lips should create a seal on your breast and be turned out (everted). °· It is common for your baby to suck about 2-3 minutes in order to start the flow of breast milk. °Latching °Teaching your baby how to latch on to your breast properly is very important. An improper latch can cause nipple pain and decreased milk supply for you and poor weight gain in your baby. Also, if your baby is not latched onto your nipple properly, he or she may swallow some air during feeding. This can make your baby fussy. Burping your baby when  you switch breasts during the feeding can help to get rid of the air. However, teaching your baby to latch on properly is still the best way to prevent fussiness from swallowing air while breastfeeding. °Signs that your baby has successfully latched on to your nipple: °· Silent tugging or silent sucking, without causing you pain. °· Swallowing heard between every 3-4 sucks. °· Muscle movement above and in front of his or her ears while sucking. °Signs that your baby has not successfully latched on to nipple: °· Sucking sounds or smacking sounds from your baby while breastfeeding. °· Nipple pain. °If you think your baby has not latched on correctly, slip your finger into the corner of your baby's mouth to break the suction and place it between your baby's gums. Attempt breastfeeding initiation again. °Signs of Successful Breastfeeding °  Signs from your baby: °· A gradual decrease in the number of sucks or complete cessation of sucking. °· Falling asleep. °· Relaxation of his or her body. °· Retention of a small amount of milk in his or her mouth. °· Letting go of your breast by himself or herself. °Signs from you: °· Breasts that have increased in firmness, weight, and size 1-3 hours after feeding. °· Breasts that are softer immediately after breastfeeding. °· Increased milk volume, as well as a change in milk consistency and color by the fifth day of breastfeeding. °· Nipples that are not sore, cracked, or bleeding. °Signs That Your Baby is Getting Enough Milk °· Wetting at least 3 diapers in a 24-hour period. The urine should be clear and pale yellow by age 5 days. °· At least 3 stools in a 24-hour period by age 5 days. The stool should be soft and yellow. °· At least 3 stools in a 24-hour period by age 7 days. The stool should be seedy and yellow. °· No loss of weight greater than 10% of birth weight during the first 3 days of age. °· Average weight gain of 4-7 ounces (113-198 g) per week after age 4  days. °· Consistent daily weight gain by age 5 days, without weight loss after the age of 2 weeks. °After a feeding, your baby may spit up a small amount. This is common. °BREASTFEEDING FREQUENCY AND DURATION °Frequent feeding will help you make more milk and can prevent sore nipples and breast engorgement. Breastfeed when you feel the need to reduce the fullness of your breasts or when your baby shows signs of hunger. This is called "breastfeeding on demand." Avoid introducing a pacifier to your baby while you are working to establish breastfeeding (the first 4-6 weeks after your baby is born). After this time you may choose to use a pacifier. Research has shown that pacifier use during the first year of a baby's life decreases the risk of sudden infant death syndrome (SIDS). °Allow your baby to feed on each breast as long as he or she wants. Breastfeed until your baby is finished feeding. When your baby unlatches or falls asleep while feeding from the first breast, offer the second breast. Because newborns are often sleepy in the first few weeks of life, you may need to awaken your baby to get him or her to feed. °Breastfeeding times will vary from baby to baby. However, the following rules can serve as a guide to help you ensure that your baby is properly fed: °· Newborns (babies 4 weeks of age or younger) may breastfeed every 1-3 hours. °· Newborns should not go longer than 3 hours during the day or 5 hours during the night without breastfeeding. °· You should breastfeed your baby a minimum of 8 times in a 24-hour period until you begin to introduce solid foods to your baby at around 6 months of age. °BREAST MILK PUMPING °Pumping and storing breast milk allows you to ensure that your baby is exclusively fed your breast milk, even at times when you are unable to breastfeed. This is especially important if you are going back to work while you are still breastfeeding or when you are not able to be present during  feedings. Your lactation consultant can give you guidelines on how long it is safe to store breast milk. °A breast pump is a machine that allows you to pump milk from your breast into a sterile bottle. The pumped breast milk can   then be stored in a refrigerator or freezer. Some breast pumps are operated by hand, while others use electricity. Ask your lactation consultant which type will work best for you. Breast pumps can be purchased, but some hospitals and breastfeeding support groups lease breast pumps on a monthly basis. A lactation consultant can teach you how to hand express breast milk, if you prefer not to use a pump. °CARING FOR YOUR BREASTS WHILE YOU BREASTFEED °Nipples can become dry, cracked, and sore while breastfeeding. The following recommendations can help keep your breasts moisturized and healthy: °· Avoid using soap on your nipples. °· Wear a supportive bra. Although not required, special nursing bras and tank tops are designed to allow access to your breasts for breastfeeding without taking off your entire bra or top. Avoid wearing underwire-style bras or extremely tight bras. °· Air dry your nipples for 3-4 minutes after each feeding. °· Use only cotton bra pads to absorb leaked breast milk. Leaking of breast milk between feedings is normal. °· Use lanolin on your nipples after breastfeeding. Lanolin helps to maintain your skin's normal moisture barrier. If you use pure lanolin, you do not need to wash it off before feeding your baby again. Pure lanolin is not toxic to your baby. You may also hand express a few drops of breast milk and gently massage that milk into your nipples and allow the milk to air dry. °In the first few weeks after giving birth, some women experience extremely full breasts (engorgement). Engorgement can make your breasts feel heavy, warm, and tender to the touch. Engorgement peaks within 3-5 days after you give birth. The following recommendations can help ease  engorgement: °· Completely empty your breasts while breastfeeding or pumping. You may want to start by applying warm, moist heat (in the shower or with warm water-soaked hand towels) just before feeding or pumping. This increases circulation and helps the milk flow. If your baby does not completely empty your breasts while breastfeeding, pump any extra milk after he or she is finished. °· Wear a snug bra (nursing or regular) or tank top for 1-2 days to signal your body to slightly decrease milk production. °· Apply ice packs to your breasts, unless this is too uncomfortable for you. °· Make sure that your baby is latched on and positioned properly while breastfeeding. °If engorgement persists after 48 hours of following these recommendations, contact your health care provider or a lactation consultant. °OVERALL HEALTH CARE RECOMMENDATIONS WHILE BREASTFEEDING °· Eat healthy foods. Alternate between meals and snacks, eating 3 of each per day. Because what you eat affects your breast milk, some of the foods may make your baby more irritable than usual. Avoid eating these foods if you are sure that they are negatively affecting your baby. °· Drink milk, fruit juice, and water to satisfy your thirst (about 10 glasses a day). °· Rest often, relax, and continue to take your prenatal vitamins to prevent fatigue, stress, and anemia. °· Continue breast self-awareness checks. °· Avoid chewing and smoking tobacco. Chemicals from cigarettes that pass into breast milk and exposure to secondhand smoke may harm your baby. °· Avoid alcohol and drug use, including marijuana. °Some medicines that may be harmful to your baby can pass through breast milk. It is important to ask your health care provider before taking any medicine, including all over-the-counter and prescription medicine as well as vitamin and herbal supplements. °It is possible to become pregnant while breastfeeding. If birth control is desired, ask your health care    provider about options that will be safe for your baby. °SEEK MEDICAL CARE IF: °· You feel like you want to stop breastfeeding or have become frustrated with breastfeeding. °· You have painful breasts or nipples. °· Your nipples are cracked or bleeding. °· Your breasts are red, tender, or warm. °· You have a swollen area on either breast. °· You have a fever or chills. °· You have nausea or vomiting. °· You have drainage other than breast milk from your nipples. °· Your breasts do not become full before feedings by the fifth day after you give birth. °· You feel sad and depressed. °· Your baby is too sleepy to eat well. °· Your baby is having trouble sleeping.   °· Your baby is wetting less than 3 diapers in a 24-hour period. °· Your baby has less than 3 stools in a 24-hour period. °· Your baby's skin or the white part of his or her eyes becomes yellow.   °· Your baby is not gaining weight by 5 days of age. °SEEK IMMEDIATE MEDICAL CARE IF: °· Your baby is overly tired (lethargic) and does not want to wake up and feed. °· Your baby develops an unexplained fever. °  °This information is not intended to replace advice given to you by your health care provider. Make sure you discuss any questions you have with your health care provider. °  °Document Released: 01/07/2005 Document Revised: 09/28/2014 Document Reviewed: 07/01/2012 °Elsevier Interactive Patient Education ©2016 [employer] ° °

## 2015-08-18 NOTE — Lactation Note (Signed)
This note was copied from a baby's chart. Lactation Consultation Note  Patient Name: Samantha Gould [id number] Date: 08/18/2015  Follow up visit made.  Mom states she is pumping every 2 hours and obtaining drops.  Discussed milk coming to volume.  She reports breasts feeling heavier this AM.  Mom plans on a 2 week pump rental before discharge.  Reviewed outpatient lactation services and support.   Maternal Data    Feeding Feeding Type: Formula Nipple Type: Regular Length of feed: 20 min  LATCH Score/Interventions                      Lactation Tools Discussed/Used     Consult Status      Huston Foley 08/18/2015, 10:07 AM

## 2015-08-25 DIAGNOSIS — O135 Gestational [pregnancy-induced] hypertension without significant proteinuria, complicating the puerperium: Secondary | ICD-10-CM | POA: Diagnosis not present

## 2015-08-26 ENCOUNTER — Ambulatory Visit: Payer: Self-pay

## 2015-08-26 NOTE — Lactation Note (Signed)
This note was copied from a baby's chart. Lactation Consultation Note  Patient Name: Samantha Gould [id number] Date: 08/26/2015 Reason for consult: Follow-up assessment;NICU baby Infant is 91 days old; LC was called by NICU nurse because mom was at baby's bedside & had some questions about supply. Mom stated she had been pumping q 2-3 hours but noticed that she received more if she pumped q 4-5 hours (fills 2 full 4oz bottles). Mom is wanting to latch baby more often when at the NICU so wondered about her milk supply if she had recently pumped & tried BF. Discussed how her breasts always have milk & that her body makes more when she removes some either via baby or pump so to not worry too much about when she pumped last, instead to BF when she was with baby on cue & then pump later. Also encouraged mom to try to not go longer than 3-4hrs between pumping sessions to keep up her supply. Encouraged mom to follow pumping with hand expression as well. Mom reports no other questions at this time. Encouraged mom to ask for [facility] when she decides to BF if help is needed.  Maternal Data    Feeding    LATCH Score/Interventions                      Lactation Tools Discussed/Used     Consult Status Consult Status: PRN    Oneal Grout 08/26/2015, 3:02 PM

## 2015-09-11 DIAGNOSIS — O8612 Endometritis following delivery: Secondary | ICD-10-CM | POA: Diagnosis not present

## 2015-09-11 DIAGNOSIS — R102 Pelvic and perineal pain: Secondary | ICD-10-CM | POA: Diagnosis not present

## 2015-10-02 ENCOUNTER — Encounter: Payer: Self-pay | Admitting: Family Medicine

## 2015-10-02 ENCOUNTER — Ambulatory Visit (INDEPENDENT_AMBULATORY_CARE_PROVIDER_SITE_OTHER): Payer: BLUE CROSS/BLUE SHIELD | Admitting: Family Medicine

## 2015-10-02 ENCOUNTER — Telehealth: Payer: Self-pay | Admitting: Internal Medicine

## 2015-10-02 VITALS — BP 134/88 | HR 84 | Temp 98.2°F | Ht 66.0 in | Wt 215.2 lb

## 2015-10-02 DIAGNOSIS — J0111 Acute recurrent frontal sinusitis: Secondary | ICD-10-CM

## 2015-10-02 DIAGNOSIS — R0981 Nasal congestion: Secondary | ICD-10-CM | POA: Diagnosis not present

## 2015-10-02 DIAGNOSIS — Z3041 Encounter for surveillance of contraceptive pills: Secondary | ICD-10-CM | POA: Diagnosis not present

## 2015-10-02 DIAGNOSIS — Z130 Encounter for screening for diseases of the blood and blood-forming organs and certain disorders involving the immune mechanism: Secondary | ICD-10-CM | POA: Diagnosis not present

## 2015-10-02 MED ORDER — AMOXICILLIN 500 MG PO CAPS: 1000.0000 mg | ORAL_CAPSULE | Freq: Two times a day (BID) | ORAL | 0 refills | Status: DC

## 2015-10-02 NOTE — Telephone Encounter (Signed)
Recommend to schedule OV w/ PCP to discuss restarting Depo.

## 2015-10-02 NOTE — Patient Instructions (Signed)
We are going to treat you for a sinus infection with amoxicillin- take this antibiotic twice daily for 10 days I think that this will also resolve the issue with your right ear, but please let me know if this does not go away, or if you have any persistent, worsening or new symptoms. Take care!

## 2015-10-02 NOTE — Progress Notes (Signed)
[facility] [address], [address] [place], [place] [phone] [phone]  Date:  10/02/2015   Name:  Samantha Gould   DOB:  03-27-1985   MRN:  [id number]  PCP:  Willow OraJose Paz, MD    Chief Complaint: Sinusitis (c/o sinus headache, and tooth ache. pt states that it feels like she can hear the wind blowing in her right ear.)   History of Present Illness:  Samantha Gould is a 30 y.o. very pleasant female patient who presents with the following:  Here today with illness- she has noted recurrent sinus symptoms which typically occur "the same time every year." Her sx first appeared 3 weeks ago- She first noted a feeling of wind blowing in her right ear; this is now resolved. She then noted stuffy nose, sneezing, pressure in her frontal sinuses, and today she noted onset of a right sided ST.  At this time her main concern is pain and pressure in her sinuses- she is pretty sure that she has a sinus infection again.   She has note noted a fever, occasional cough.  She is sneezing a lot She is able to eat ok but does not have a big appetite currently  Generally in good health; she delivered a son on 7/26 about 6 weeks early.  He is doing well; he was in the NICU for 12 days but is now doing well at home She is nursing some but does use a good bit of formula also to ensure he gets enough to eat She is quite tired and thinks that her fatigue is probably contributing to her illness   Patient Active Problem List   Diagnosis Date Noted  . Postpartum care following vaginal delivery (7/26) 08/16/2015  . Preterm premature rupture of membranes 08/15/2015  . Indication for care in labor or delivery 08/15/2015  . PCP NOTES >>> 10/26/2014  . Sinusitis, acute maxillary 05/02/2014  . Migraine without aura 11/07/2006    Past Medical History:  Diagnosis Date  . Currently pregnant 01/2011  . Gestational diabetes   . Hypertension    with pregnancy only   . Migraine    migraines; Exedrine Migraine works    Past Surgical History:  Procedure Laterality Date  . NO PAST SURGERIES      Social History  Substance Use Topics  . Smoking status: Former Smoker    Quit date: 08/24/2013  . Smokeless tobacco: Never Used     Comment: 2-3 cig/ day on average  . Alcohol use No    Family History  Problem Relation Age of Onset  . Breast cancer Maternal Grandmother   . Diabetes Maternal Grandmother   . Sleep apnea Father   . Sleep apnea Maternal Grandfather   . Diabetes Mother   . Migraines Sister     No Known Allergies  Medication list has been reviewed and updated.  Current Outpatient Prescriptions on File Prior to Visit  Medication Sig Dispense Refill  . acetaminophen (TYLENOL) 500 MG tablet Take 1,000 mg by mouth every 6 (six) hours as needed for moderate pain.    . calcium carbonate (TUMS - DOSED IN MG ELEMENTAL CALCIUM) 500 MG chewable tablet Chew 2 tablets by mouth 3 (three) times daily as needed for indigestion or heartburn.    . ferrous sulfate 325 (65 FE) MG tablet Take 1 tablet (325 mg total) by mouth 2 (two) times daily with a meal. 60 tablet 3  . fluticasone (  FLONASE) 50 MCG/ACT nasal spray Place 2 sprays into both nostrils daily.    Marland Kitchen ibuprofen (ADVIL,MOTRIN) 600 MG tablet Take 1 tablet (600 mg total) by mouth every 6 (six) hours. 30 tablet 0  . Prenatal Vit-Fe Fumarate-FA (PRENATAL MULTIVITAMIN) TABS tablet Take 1 tablet by mouth daily at 12 noon.     No current facility-administered medications on file prior to visit.     Review of Systems:  As per HPI- otherwise negative.   Physical Examination: Vitals:   10/02/15 1421  BP: 134/88  Pulse: 100  Temp: 98.2 F (36.8 C)   Vitals:   10/02/15 1421  Weight: 215 lb 3.2 oz (97.6 kg)  Height: 5\' 6"  (1.676 m)   Body mass index is 34.73 kg/m. Ideal Body Weight: Weight in (lb) to have BMI = 25: 154.6  GEN: WDWN, NAD, Non-toxic, A & O x 3, looks well HEENT:  Atraumatic, Normocephalic. Neck supple. No masses, No LAD.  Bilateral TM wnl, oropharynx normal.  PEERL,EOMI.    Moved cerumen on right, normal TM and IAC Nasal turbinates are inflamed and swollen, discharge in nose.   Ears and Nose: No external deformity. CV: RRR, No M/G/R. No JVD. No thrill. No extra heart sounds. PULM: CTA B, no wheezes, crackles, rhonchi. No retractions. No resp. distress. No accessory muscle use. ABD: recently delivered, no tenderness  EXTR: No c/c/e NEURO Normal gait.  PSYCH: Normally interactive. Conversant. Not depressed or anxious appearing.  Calm demeanor.     Assessment and Plan: Acute recurrent frontal sinusitis - Plan: amoxicillin (AMOXIL) 500 MG capsule  Nasal congestion  Treat for a sinus infection with amoxicillin- safe for nursing Asked her to please let me know if not improving soon- Sooner if worse.  See patient instructions for more details.    Meds ordered this encounter  Medications  . amoxicillin (AMOXIL) 500 MG capsule    Sig: Take 2 capsules (1,000 mg total) by mouth 2 (two) times daily.    Dispense:  40 capsule    Refill:  0  '   Signed Abbe Amsterdam, MD

## 2015-10-02 NOTE — Telephone Encounter (Signed)
Pt called in to request her depo shot. Pt says that she just had her baby about 7 weeks ago and is ready to start back on her shots.    Scheduled nurse visit for pt . (tomorrow) @ 2:15.

## 2015-10-03 ENCOUNTER — Ambulatory Visit: Payer: BLUE CROSS/BLUE SHIELD

## 2015-10-03 NOTE — Telephone Encounter (Signed)
Scheduled pt to come in to see PCP. Looked back at appt desk, showing pt cancelled her appt.

## 2015-10-04 ENCOUNTER — Ambulatory Visit: Payer: BLUE CROSS/BLUE SHIELD | Admitting: Internal Medicine

## 2015-10-13 DIAGNOSIS — O24430 Gestational diabetes mellitus in the puerperium, diet controlled: Secondary | ICD-10-CM | POA: Diagnosis not present

## 2015-10-27 ENCOUNTER — Encounter: Payer: Self-pay | Admitting: Medical

## 2015-10-27 ENCOUNTER — Ambulatory Visit (INDEPENDENT_AMBULATORY_CARE_PROVIDER_SITE_OTHER): Payer: BLUE CROSS/BLUE SHIELD | Admitting: Medical

## 2015-10-27 VITALS — BP 144/103 | HR 85 | Temp 98.4°F | Ht 66.0 in | Wt 214.8 lb

## 2015-10-27 DIAGNOSIS — R067 Sneezing: Secondary | ICD-10-CM

## 2015-10-27 DIAGNOSIS — R0981 Nasal congestion: Secondary | ICD-10-CM | POA: Diagnosis not present

## 2015-10-27 DIAGNOSIS — J0100 Acute maxillary sinusitis, unspecified: Secondary | ICD-10-CM | POA: Diagnosis not present

## 2015-10-27 DIAGNOSIS — R030 Elevated blood-pressure reading, without diagnosis of hypertension: Secondary | ICD-10-CM | POA: Diagnosis not present

## 2015-10-27 MED ORDER — FLUTICASONE PROPIONATE 50 MCG/ACT NA SUSP: 2.0000 | Freq: Every day | NASAL | 1 refills | Status: DC

## 2015-10-27 MED ORDER — CEFTRIAXONE SODIUM 1 G IJ SOLR
1.0000 g | Freq: Once | INTRAMUSCULAR | Status: AC
  Administered 2015-10-27: 1 g via INTRAMUSCULAR

## 2015-10-27 MED ORDER — AMOXICILLIN-POT CLAVULANATE 875-125 MG PO TABS: 1.0000 | ORAL_TABLET | Freq: Two times a day (BID) | ORAL | 0 refills | Status: DC

## 2015-10-27 MED ORDER — BENZONATATE 100 MG PO CAPS: 100.0000 mg | ORAL_CAPSULE | Freq: Three times a day (TID) | ORAL | 0 refills | Status: DC | PRN

## 2015-10-27 NOTE — Progress Notes (Signed)
Subjective:    Patient ID: Samantha Gould, female    DOB: [birth year], 30 y.o.   MRN: [id number]  HPI  Pt in feeling sick for sinus pain. She states last 3 days is severe.  Pt has some mucous when she blows her nose. She was previously on amoxicillin for about 10 days. She was given antibiotic for sinus infection. While on antibiotic her symptoms did not get better. Pt over last 3 weeks still feeling nasal congestion,ear pressure and sinus pressure.  LMP- oct 2016. She delivered child and then given Depo- provera on Sept 11, 2017. Child 2 months old but not breast feeding.    Review of Systems  Constitutional: Negative for chills, fatigue and fever.  HENT: Positive for congestion, ear pain and sinus pressure. Negative for dental problem and sore throat.   Respiratory: Positive for cough. Negative for chest tightness, shortness of breath and wheezing.   Cardiovascular: Negative for chest pain and palpitations.  Gastrointestinal: Negative for abdominal pain.  Musculoskeletal: Negative for back pain and neck pain.  Neurological: Negative for dizziness, speech difficulty, weakness, numbness and headaches.  Hematological: Negative for adenopathy. Does not bruise/bleed easily.  Psychiatric/Behavioral: Negative for behavioral problems and confusion.    Past Medical History:  Diagnosis Date  . Currently pregnant 01/2011  . Gestational diabetes   . Hypertension    with pregnancy only  . Migraine    migraines; Exedrine Migraine works     Social History   Social History  . Marital status: Married    Spouse name: N/A  . Number of children: N/A  . Years of education: N/A   Occupational History  . Not on file.   Social History Main Topics  . Smoking status: Former Smoker    Quit date: 08/24/2013  . Smokeless tobacco: Never Used     Comment: 2-3 cig/ day on average  . Alcohol use No  . Drug use: No  . Sexual activity: Not Currently   Other Topics Concern  . Not on file    Social History Narrative  . No narrative on file    Past Surgical History:  Procedure Laterality Date  . NO PAST SURGERIES      Family History  Problem Relation Age of Onset  . Breast cancer Maternal Grandmother   . Diabetes Maternal Grandmother   . Sleep apnea Father   . Sleep apnea Maternal Grandfather   . Diabetes Mother   . Migraines Sister     No Known Allergies  Current Outpatient Prescriptions on File Prior to Visit  Medication Sig Dispense Refill  . ibuprofen (ADVIL,MOTRIN) 600 MG tablet Take 1 tablet (600 mg total) by mouth every 6 (six) hours. 30 tablet 0  . Prenatal Vit-Fe Fumarate-FA (PRENATAL MULTIVITAMIN) TABS tablet Take 1 tablet by mouth daily at 12 noon.    Marland Kitchen acetaminophen (TYLENOL) 500 MG tablet Take 1,000 mg by mouth every 6 (six) hours as needed for moderate pain.    Marland Kitchen amoxicillin (AMOXIL) 500 MG capsule Take 2 capsules (1,000 mg total) by mouth 2 (two) times daily. (Patient not taking: Reported on 10/27/2015) 40 capsule 0  . calcium carbonate (TUMS - DOSED IN MG ELEMENTAL CALCIUM) 500 MG chewable tablet Chew 2 tablets by mouth 3 (three) times daily as needed for indigestion or heartburn.    . ferrous sulfate 325 (65 FE) MG tablet Take 1 tablet (325 mg total) by mouth 2 (two) times daily with a meal. (Patient not taking: Reported on 10/27/2015) 60  tablet 3  . fluticasone (FLONASE) 50 MCG/ACT nasal spray Place 2 sprays into both nostrils daily.     No current facility-administered medications on file prior to visit.     BP (!) 144/103 Comment: Per ES  Pulse 85   Temp 98.4 F (36.9 C) (Oral)   Ht 5\' 6"  (1.676 m)   Wt 214 lb 12.8 oz (97.4 kg)   LMP  (LMP Unknown)   BMI 34.67 kg/m       Objective:   Physical Exam  General  Mental Status - Alert. General Appearance - Well groomed. Not in acute distress.  Skin Rashes- No Rashes.  HEENT Head- Normal. Ear Auditory Canal - Left- Normal. Right - Normal.Tympanic Membrane- Left- Normal. Right-  Normal. Eye Sclera/Conjunctiva- Left- Normal. Right- Normal. Nose & Sinuses Nasal Mucosa- Left-  Boggy and Congested. Right-  Boggy and  Congested.Bilateral maxillary and frontal sinus pressure. Mouth & Throat Lips: Upper Lip- Normal: no dryness, cracking, pallor, cyanosis, or vesicular eruption. Lower Lip-Normal: no dryness, cracking, pallor, cyanosis or vesicular eruption. Buccal Mucosa- Bilateral- No Aphthous ulcers. Oropharynx- No Discharge or Erythema. Tonsils: Characteristics- Bilateral- No Erythema or Congestion. Size/Enlargement- Bilateral- No enlargement. Discharge- bilateral-None.  Neck Neck- Supple. No Masses.   Chest and Lung Exam Auscultation: Breath Sounds:-Clear even and unlabored.  Cardiovascular Auscultation:Rythm- Regular, rate and rhythm. Murmurs & Other Heart Sounds:Ausculatation of the heart reveal- No Murmurs.  Lymphatic Head & Neck General Head & Neck Lymphatics: Bilateral: Description- No Localized lymphadenopathy.      Assessment & Plan:  Your appear to have a sinus infection. I am prescribing augmentin antibiotic for the infection. To help with the nasal congestion I prescribed  flonase nasal steroid. For your associated cough, I prescribed cough medicine benzonatate.  Since your sinus pressure/likely infection has persisted for 3 weeks we gave rocephin 1 gram IM.  Rest, hydrate, tylenol for fever.  Please stop otc decongestant since appears elevated you blood pressure.  Follow up in 7 days or as needed.  Also asked pt to come in and get bp check in one week. During interim dash diet.  Samantha Gould, Samantha DredgeEdward, PA-C

## 2015-10-27 NOTE — Patient Instructions (Addendum)
Your appear to have a sinus infection. I am prescribing augmentin antibiotic for the infection. To help with the nasal congestion I prescribed  flonase nasal steroid. For your associated cough, I prescribed cough medicine benzonatate.  Since your sinus pressure/likely infection has persisted for 3 weeks we gave rocephin 1 gram IM.  Rest, hydrate, tylenol for fever.  Please stop otc decongestant since appears elevated you blood pressure.   Follow up in 7 days or as needed.  DASH Eating Plan DASH stands for "Dietary Approaches to Stop Hypertension." The DASH eating plan is a healthy eating plan that has been shown to reduce high blood pressure (hypertension). Additional health benefits may include reducing the risk of type 2 diabetes mellitus, heart disease, and stroke. The DASH eating plan may also help with weight loss. WHAT DO I NEED TO KNOW ABOUT THE DASH EATING PLAN? For the DASH eating plan, you will follow these general guidelines:  Choose foods with a percent daily value for sodium of less than 5% (as listed on the food label).  Use salt-free seasonings or herbs instead of table salt or sea salt.  Check with your health care provider or pharmacist before using salt substitutes.  Eat lower-sodium products, often labeled as "lower sodium" or "no salt added."  Eat fresh foods.  Eat more vegetables, fruits, and low-fat dairy products.  Choose whole grains. Look for the word "whole" as the first word in the ingredient list.  Choose fish and skinless chicken or [place] more often than red meat. Limit fish, poultry, and meat to 6 oz (170 g) each day.  Limit sweets, desserts, sugars, and sugary drinks.  Choose heart-healthy fats.  Limit cheese to 1 oz (28 g) per day.  Eat more home-cooked food and less restaurant, buffet, and fast food.  Limit fried foods.  Cook foods using methods other than frying.  Limit canned vegetables. If you do use them, rinse them well to decrease the  sodium.  When eating at a restaurant, ask that your food be prepared with less salt, or no salt if possible. WHAT FOODS CAN I EAT? Seek help from a dietitian for individual calorie needs. Grains Whole grain or whole wheat bread. Brown rice. Whole grain or whole wheat pasta. Quinoa, bulgur, and whole grain cereals. Low-sodium cereals. Corn or whole wheat flour tortillas. Whole grain cornbread. Whole grain crackers. Low-sodium crackers. Vegetables Fresh or frozen vegetables (raw, steamed, roasted, or grilled). Low-sodium or reduced-sodium tomato and vegetable juices. Low-sodium or reduced-sodium tomato sauce and paste. Low-sodium or reduced-sodium canned vegetables.  Fruits All fresh, canned (in natural juice), or frozen fruits. Meat and Other Protein Products Ground beef (85% or leaner), grass-fed beef, or beef trimmed of fat. Skinless chicken or [place]. Ground chicken or [place]. Pork trimmed of fat. All fish and seafood. Eggs. Dried beans, peas, or lentils. Unsalted nuts and seeds. Unsalted canned beans. Dairy Low-fat dairy products, such as skim or 1% milk, 2% or reduced-fat cheeses, low-fat ricotta or cottage cheese, or plain low-fat yogurt. Low-sodium or reduced-sodium cheeses. Fats and Oils Tub margarines without trans fats. Light or reduced-fat mayonnaise and salad dressings (reduced sodium). Avocado. Safflower, olive, or canola oils. Natural peanut or almond butter. Other Unsalted popcorn and pretzels. The items listed above may not be a complete list of recommended foods or beverages. Contact your dietitian for more options. WHAT FOODS ARE NOT RECOMMENDED? Grains White bread. White pasta. White rice. Refined cornbread. Bagels and croissants. Crackers that contain trans fat. Vegetables Creamed or  fried vegetables. Vegetables in a cheese sauce. Regular canned vegetables. Regular canned tomato sauce and paste. Regular tomato and vegetable juices. Fruits Dried fruits. Canned fruit in  light or heavy syrup. Fruit juice. Meat and Other Protein Products Fatty cuts of meat. Ribs, chicken wings, bacon, sausage, bologna, salami, chitterlings, fatback, hot dogs, bratwurst, and packaged luncheon meats. Salted nuts and seeds. Canned beans with salt. Dairy Whole or 2% milk, cream, half-and-half, and cream cheese. Whole-fat or sweetened yogurt. Full-fat cheeses or blue cheese. Nondairy creamers and whipped toppings. Processed cheese, cheese spreads, or cheese curds. Condiments Onion and garlic salt, seasoned salt, table salt, and sea salt. Canned and packaged gravies. Worcestershire sauce. Tartar sauce. Barbecue sauce. Teriyaki sauce. Soy sauce, including reduced sodium. Steak sauce. Fish sauce. Oyster sauce. Cocktail sauce. Horseradish. Ketchup and mustard. Meat flavorings and tenderizers. Bouillon cubes. Hot sauce. Tabasco sauce. Marinades. Taco seasonings. Relishes. Fats and Oils Butter, stick margarine, lard, shortening, ghee, and bacon fat. Coconut, palm kernel, or palm oils. Regular salad dressings. Other Pickles and olives. Salted popcorn and pretzels. The items listed above may not be a complete list of foods and beverages to avoid. Contact your dietitian for more information. WHERE CAN I FIND MORE INFORMATION? National Heart, Lung, and Blood Institute: [employer]   This information is not intended to replace advice given to you by your health care provider. Make sure you discuss any questions you have with your health care provider.   Document Released: 12/27/2010 Document Revised: 01/28/2014 Document Reviewed: 11/11/2012 Elsevier Interactive Patient Education [employer]

## 2015-10-27 NOTE — Progress Notes (Signed)
Pre visit review using our clinic tool,if applicable. No additional management support is needed unless otherwise documented below in the visit note.  

## 2015-11-03 ENCOUNTER — Ambulatory Visit (INDEPENDENT_AMBULATORY_CARE_PROVIDER_SITE_OTHER): Payer: BLUE CROSS/BLUE SHIELD | Admitting: Medical

## 2015-11-03 VITALS — BP 140/92 | HR 89

## 2015-11-03 DIAGNOSIS — R030 Elevated blood-pressure reading, without diagnosis of hypertension: Secondary | ICD-10-CM | POA: Diagnosis not present

## 2015-11-03 MED ORDER — AMLODIPINE BESYLATE 2.5 MG PO TABS: 2.5000 mg | ORAL_TABLET | Freq: Every day | ORAL | 0 refills | Status: DC

## 2015-11-03 NOTE — Progress Notes (Addendum)
Pre visit review using our clinic review tool, if applicable. No additional management support is needed unless otherwise documented below in the visit note.  Pt here for BP check per 10/27/15 office visit note.   Pt report she has been trying to follow DASH diet recommendations. She does not take BP medication. She does not check BP at home. She states her BP has been elevated since she gave birth in July. She is not currently breastfeeding.  Per Esperanza RichtersEdward Saguier, PA-C: Start taking amlodipine 2.5 mg daily. Follow-up in 2 weeks for office visit.   Pt was unable to wait in office and requested to be notified of recommendation via phone.   Called pt and notified her of recommendations and she verbalized understanding. Medication filled to pharmacy as requested. She was instructed to call the office if any adverse side effects, such as swelling, after starting medication.   Next appointment: 11/20/15 w/ PCP  Samantha Linkarolyn J Joselynne Killam, RN   Above is the advise that I gave after discussion with RN.  Gould, Samantha DredgeEdward, PA-C

## 2015-11-15 DIAGNOSIS — Z136 Encounter for screening for cardiovascular disorders: Secondary | ICD-10-CM | POA: Diagnosis not present

## 2015-11-20 ENCOUNTER — Ambulatory Visit (INDEPENDENT_AMBULATORY_CARE_PROVIDER_SITE_OTHER): Payer: BLUE CROSS/BLUE SHIELD | Admitting: Internal Medicine

## 2015-11-20 VITALS — BP 118/88 | HR 98 | Temp 97.7°F | Ht 65.0 in | Wt 215.6 lb

## 2015-11-20 DIAGNOSIS — R030 Elevated blood-pressure reading, without diagnosis of hypertension: Secondary | ICD-10-CM | POA: Diagnosis not present

## 2015-11-20 DIAGNOSIS — Z09 Encounter for follow-up examination after completed treatment for conditions other than malignant neoplasm: Secondary | ICD-10-CM | POA: Diagnosis not present

## 2015-11-20 MED ORDER — AMLODIPINE BESYLATE 2.5 MG PO TABS: 2.5000 mg | ORAL_TABLET | Freq: Every day | ORAL | 1 refills | Status: DC

## 2015-11-20 NOTE — Progress Notes (Signed)
Subjective:    Patient ID: Samantha Gould, female    DOB: Jul 05, 1985, 30 y.o.   MRN: [id number]  DOS:  11/20/2015 Type of visit - description : Follow-up Interval history: BP has been mildly elevated since her pregnancy, started on amlodipine recently, has been taking her blood pressure and at least one time was 122/94. She is not breast-feeding   BP Readings from Last 3 Encounters:  11/20/15 118/88  11/03/15 (!) 140/92  10/27/15 (!) 144/103     Review of Systems See nurses notes (SOB, CP) : she denies current chest pain, difficulty breathing, swelling . No apparent side effects.  Past Medical History:  Diagnosis Date  . Gestational diabetes   . Hypertension    with pregnancy only  . Migraine    migraines; Exedrine Migraine works    Past Surgical History:  Procedure Laterality Date  . NO PAST SURGERIES      Social History   Social History  . Marital status: Married    Spouse name: N/A  . Number of children: N/A  . Years of education: N/A   Occupational History  . Not on file.   Social History Main Topics  . Smoking status: Former Smoker    Quit date: 08/24/2013  . Smokeless tobacco: Never Used  . Alcohol use No  . Drug use: No  . Sexual activity: Not Currently   Other Topics Concern  . Not on file   Social History Narrative  . No narrative on file        Medication List       Accurate as of 11/20/15  9:02 AM. Always use your most recent med list.          acetaminophen 500 MG tablet Commonly known as:  TYLENOL Take 1,000 mg by mouth every 6 (six) hours as needed for moderate pain.   amLODipine 2.5 MG tablet Commonly known as:  NORVASC Take 1 tablet (2.5 mg total) by mouth daily.   amoxicillin-clavulanate 875-125 MG tablet Commonly known as:  AUGMENTIN Take 1 tablet by mouth 2 (two) times daily.   benzonatate 100 MG capsule Commonly known as:  TESSALON Take 1 capsule (100 mg total) by mouth 3 (three) times daily as needed for  cough.   fluticasone 50 MCG/ACT nasal spray Commonly known as:  FLONASE Place 2 sprays into both nostrils daily.   ibuprofen 600 MG tablet Commonly known as:  ADVIL,MOTRIN Take 1 tablet (600 mg total) by mouth every 6 (six) hours.   prenatal multivitamin Tabs tablet Take 1 tablet by mouth daily at 12 noon.          Objective:   Physical Exam BP 118/88 (BP Location: Left Arm, Patient Position: Sitting, Cuff Size: Large)   Pulse 98   Temp 97.7 F (36.5 C) (Oral)   Ht 5\' 5"  (1.651 m)   Wt 215 lb 9.6 oz (97.8 kg)   LMP  (LMP Unknown)   SpO2 97%   BMI 35.88 kg/m   General:   Well developed, well nourished . NAD.  HEENT:  Normocephalic . Face symmetric, atraumatic Lungs:  CTA B Normal respiratory effort, no intercostal retractions, no accessory muscle use. Heart: RRR,  no murmur.  No pretibial edema bilaterally  Skin: Not pale. Not jaundice Neurologic:  alert & oriented X3.  Speech normal, gait appropriate for age and unassisted Psych--  Cognition and judgment appear intact.  Cooperative with normal attention span and concentration.  Behavior appropriate. No anxious or depressed  appearing.      Assessment & Plan:   Assessment Elevated BP Migraines H/o  Gestational diabetes  Plan: Elevated BP: Developed elevated BP during pregnancy, delivery was June 2017, started amlodipine ~ 2 weeks ago, no side effects. Currently not breast-feeding and on Depo shots. Plan : Continue with a healthy diet, amlodipine, monitor BPs, follow-up 4 months, consider discontinue medications then. Strongly declined a flu shot H/o  gestational diabetes, reassess at next visit RTC 4 months

## 2015-11-20 NOTE — Patient Instructions (Signed)
  GO TO THE FRONT DESK Schedule your next appointment for a  Check up in 4 months    Check the  blood pressure   Weekly  Be sure your blood pressure is between 110/65 and  155/85. If it is consistently higher or lower, let me know

## 2015-11-20 NOTE — Progress Notes (Signed)
Pre visit review using our clinic review tool, if applicable. No additional management support is needed unless otherwise documented below in the visit note. 

## 2015-11-21 NOTE — Assessment & Plan Note (Addendum)
Elevated HK:[id number]:Developed elevated BP during pregnancy, delivery was June 2017, started amlodipine ~ 2 weeks ago, no side effects. Currently not breast-feeding and on Depo shots. Plan : Continue with a healthy diet, amlodipine, monitor BPs, follow-up 4 months, consider discontinue medications then. Strongly declined a flu shot H/o  gestational diabetes, reassess at next visit RTC 4 months

## 2015-12-08 DIAGNOSIS — E119 Type 2 diabetes mellitus without complications: Secondary | ICD-10-CM | POA: Diagnosis not present

## 2015-12-18 ENCOUNTER — Telehealth: Payer: Self-pay | Admitting: Internal Medicine

## 2015-12-18 NOTE — Telephone Encounter (Signed)
lvm advising patient of message below °

## 2015-12-18 NOTE — Telephone Encounter (Signed)
Pt seen 11/20/2015, okay to restart Depo injections as Pt's convenience.

## 2015-12-18 NOTE — Telephone Encounter (Signed)
Patient would like to schedule depo injection requesting orders,please advise

## 2015-12-20 ENCOUNTER — Ambulatory Visit (INDEPENDENT_AMBULATORY_CARE_PROVIDER_SITE_OTHER): Payer: BLUE CROSS/BLUE SHIELD | Admitting: *Deleted

## 2015-12-20 DIAGNOSIS — Z3042 Encounter for surveillance of injectable contraceptive: Secondary | ICD-10-CM

## 2015-12-20 LAB — POCT URINE PREGNANCY: PREG TEST UR: NEGATIVE

## 2015-12-20 MED ORDER — MEDROXYPROGESTERONE ACETATE 150 MG/ML IM SUSP
150.0000 mg | Freq: Once | INTRAMUSCULAR | Status: AC
  Administered 2015-12-20: 150 mg via INTRAMUSCULAR

## 2015-12-20 NOTE — Progress Notes (Addendum)
Pre visit review using our clinic review tool, if applicable. No additional management support is needed unless otherwise documented below in the visit note.   Urine pregnancy result negative.  Patient tolerated injection well. Next appointment: 02/23/16 @1045 .   Willow OraJose Paz, MD

## 2016-01-14 ENCOUNTER — Emergency Department (HOSPITAL_BASED_OUTPATIENT_CLINIC_OR_DEPARTMENT_OTHER)
Admission: EM | Admit: 2016-01-14 | Discharge: 2016-01-14 | Disposition: A | Discharge status: Home / Self Care | Payer: BLUE CROSS/BLUE SHIELD | Attending: Emergency Medicine | Admitting: Emergency Medicine

## 2016-01-14 ENCOUNTER — Encounter (HOSPITAL_BASED_OUTPATIENT_CLINIC_OR_DEPARTMENT_OTHER): Payer: Self-pay | Admitting: *Deleted

## 2016-01-14 DIAGNOSIS — Z87891 Personal history of nicotine dependence: Secondary | ICD-10-CM | POA: Insufficient documentation

## 2016-01-14 DIAGNOSIS — Z79899 Other long term (current) drug therapy: Secondary | ICD-10-CM | POA: Insufficient documentation

## 2016-01-14 DIAGNOSIS — I10 Essential (primary) hypertension: Secondary | ICD-10-CM | POA: Diagnosis not present

## 2016-01-14 DIAGNOSIS — H9202 Otalgia, left ear: Secondary | ICD-10-CM | POA: Diagnosis present

## 2016-01-14 DIAGNOSIS — H66002 Acute suppurative otitis media without spontaneous rupture of ear drum, left ear: Secondary | ICD-10-CM

## 2016-01-14 MED ORDER — AMOXICILLIN-POT CLAVULANATE 875-125 MG PO TABS: 1.0000 | ORAL_TABLET | Freq: Two times a day (BID) | ORAL | 0 refills | Status: DC

## 2016-01-14 MED ORDER — IBUPROFEN 800 MG PO TABS
800.0000 mg | ORAL_TABLET | Freq: Once | ORAL | Status: AC
  Administered 2016-01-14: 800 mg via ORAL
  Filled 2016-01-14: qty 1

## 2016-01-14 MED ORDER — AMOXICILLIN-POT CLAVULANATE 875-125 MG PO TABS
1.0000 | ORAL_TABLET | Freq: Once | ORAL | Status: AC
  Administered 2016-01-14: 1 via ORAL
  Filled 2016-01-14: qty 1

## 2016-01-14 MED ORDER — IBUPROFEN 800 MG PO TABS: 800.0000 mg | ORAL_TABLET | Freq: Three times a day (TID) | ORAL | 0 refills | Status: DC

## 2016-01-14 MED ORDER — HYDROCODONE-ACETAMINOPHEN 5-325 MG PO TABS
2.0000 | ORAL_TABLET | Freq: Once | ORAL | Status: AC
  Administered 2016-01-14: 2 via ORAL
  Filled 2016-01-14: qty 2

## 2016-01-14 MED ORDER — HYDROCODONE-ACETAMINOPHEN 5-325 MG PO TABS: 1.0000 | ORAL_TABLET | ORAL | 0 refills | Status: DC | PRN

## 2016-01-14 NOTE — ED Provider Notes (Signed)
[facility]   CSN: [id number] Arrival date & time: 01/14/16  [id number]     History   Chief Complaint Chief Complaint  Patient presents with  . Otalgia    left    HPI Samantha Gould is a 30 y.o. female.  HPI Patient reports that she has had problems with intermittent sinus infection. She was treated with amoxicillin at the end of October. She reports it temporarily improved but then came back again. She reports she got a course of Augmentin and again improved however over the past week now she is again getting nasal congestion and drainage and some facial pressure. She presents to the emergency department however due to severe ear pain that started this morning. Ports that came on very quickly. Her hearing is now muffled in the left ear and it is extremely painful. No fevers or chills. No neck stiffness. No nausea no vomiting. Mild cough. Past Medical History:  Diagnosis Date  . Gestational diabetes   . Hypertension    with pregnancy only  . Migraine    migraines; Exedrine Migraine works    Patient Active Problem List   Diagnosis Date Noted  . Postpartum care following vaginal delivery (7/26) 08/16/2015  . Preterm premature rupture of membranes 08/15/2015  . Indication for care in labor or delivery 08/15/2015  . PCP NOTES >>> 10/26/2014  . Sinusitis, acute maxillary 05/02/2014  . Migraine without aura 11/07/2006    Past Surgical History:  Procedure Laterality Date  . NO PAST SURGERIES      OB History    Gravida Para Term Preterm AB Living   1 1 0 1 0 1   SAB TAB Ectopic Multiple Live Births   0 0 0 0 1       Home Medications    Prior to Admission medications   Medication Sig Start Date End Date Taking? Authorizing Provider  amLODipine (NORVASC) 2.5 MG tablet Take 1 tablet (2.5 mg total) by mouth daily. 11/20/15  Yes Wanda PlumpJose E Paz, MD  Multiple Vitamin (MULTIVITAMIN) tablet Take 1 tablet by mouth daily.   Yes Historical Provider, MD    Prenatal Vit-Fe Fumarate-FA (PRENATAL MULTIVITAMIN) TABS tablet Take 1 tablet by mouth daily at 12 noon.   Yes Historical Provider, MD  acetaminophen (TYLENOL) 500 MG tablet Take 1,000 mg by mouth every 6 (six) hours as needed for moderate pain.    Historical Provider, MD  amoxicillin-clavulanate (AUGMENTIN) 875-125 MG tablet Take 1 tablet by mouth every 12 (twelve) hours. 01/14/16   Arby BarretteMarcy Tenley Winward, MD  HYDROcodone-acetaminophen (NORCO/VICODIN) 5-325 MG tablet Take 1-2 tablets by mouth every 4 (four) hours as needed for moderate pain or severe pain. 01/14/16   Arby BarretteMarcy Rane Dumm, MD  ibuprofen (ADVIL,MOTRIN) 800 MG tablet Take 1 tablet (800 mg total) by mouth 3 (three) times daily. 01/14/16   Arby BarretteMarcy Hebert Dooling, MD    Family History Family History  Problem Relation Age of Onset  . Breast cancer Maternal Grandmother   . Diabetes Maternal Grandmother   . Sleep apnea Father   . Sleep apnea Maternal Grandfather   . Diabetes Mother   . Migraines Sister     Social History Social History  Substance Use Topics  . Smoking status: Former Smoker    Quit date: 08/24/2013  . Smokeless tobacco: Never Used  . Alcohol use No     Allergies   Patient has no known allergies.   Review of Systems Review of Systems 10 Systems reviewed and are  negative for acute change except as noted in the HPI.   Physical Exam Updated Vital Signs BP (!) 143/102 (BP Location: Left Arm)   Pulse 97   Temp 97.8 F (36.6 C) (Oral)   Resp 20   Ht 5\' 5"  (1.651 m)   Wt 220 lb (99.8 kg)   SpO2 97%   BMI 36.61 kg/m   Physical Exam  Constitutional: She appears well-developed and well-nourished. She appears distressed.  Patient is alert and nontoxic but appears to be in severe pain holding a warming pack to her left ear.  HENT:  Head: Normocephalic and atraumatic.  Left TM bulging and erythematous with purulent effusion. Right TM normal. Oropharynx normal without erythema or exudative tonsils. Posterior or first widely  patent.  Eyes: Conjunctivae and EOM are normal.  Neck: Neck supple.  Cardiovascular: Normal rate and regular rhythm.   No murmur heard. Pulmonary/Chest: Effort normal and breath sounds normal. No respiratory distress.  Abdominal: Soft.  Musculoskeletal: She exhibits no edema.  Lymphadenopathy:    She has no cervical adenopathy.  Neurological: She is alert. No cranial nerve deficit. She exhibits normal muscle tone.  Skin: Skin is warm and dry.  Psychiatric: She has a normal mood and affect.  Nursing note and vitals reviewed.    ED Treatments / Results  Labs (all labs ordered are listed, but only abnormal results are displayed) Labs Reviewed - No data to display  EKG  EKG Interpretation None       Radiology No results found.  Procedures Procedures (including critical care time)  Medications Ordered in ED Medications  ibuprofen (ADVIL,MOTRIN) tablet 800 mg (800 mg Oral Given 01/14/16 0837)  HYDROcodone-acetaminophen (NORCO/VICODIN) 5-325 MG per tablet 2 tablet (2 tablets Oral Given 01/14/16 0837)  amoxicillin-clavulanate (AUGMENTIN) 875-125 MG per tablet 1 tablet (1 tablet Oral Given 01/14/16 0837)     Initial Impression / Assessment and Plan / ED Course  I have reviewed the triage vital signs and the nursing notes.  Pertinent labs & imaging results that were available during my care of the patient were reviewed by me and considered in my medical decision making (see chart for details).  Clinical Course     Final Clinical Impressions(s) / ED Diagnoses   Final diagnoses:  Acute suppurative otitis media of left ear without spontaneous rupture of tympanic membrane, recurrence not specified   Patient had pre-existing signs of sinus infection and URI. Acute onset of ear pain with positive findings of purulent effusion in the left ear. Treatment will be initiated with Augmentin, Vicodin and ibuprofen for pain. Patient is counseled on follow-up plan. New  Prescriptions New Prescriptions   AMOXICILLIN-CLAVULANATE (AUGMENTIN) 875-125 MG TABLET    Take 1 tablet by mouth every 12 (twelve) hours.   HYDROCODONE-ACETAMINOPHEN (NORCO/VICODIN) 5-325 MG TABLET    Take 1-2 tablets by mouth every 4 (four) hours as needed for moderate pain or severe pain.   IBUPROFEN (ADVIL,MOTRIN) 800 MG TABLET    Take 1 tablet (800 mg total) by mouth 3 (three) times daily.     Arby BarretteMarcy Spyros Winch, MD 01/14/16 [phone]

## 2016-01-14 NOTE — ED Triage Notes (Signed)
Patient states she has had cold symptoms for several days and has taken OTC meds with minimal relief.  States this morning at 0400, she woke up with severe left ear pain with radiation into her left throat.

## 2016-01-14 NOTE — ED Notes (Signed)
ED Provider at bedside. 

## 2016-01-16 ENCOUNTER — Ambulatory Visit: Payer: Self-pay

## 2016-01-16 ENCOUNTER — Telehealth: Payer: Self-pay | Admitting: Internal Medicine

## 2016-01-16 NOTE — Telephone Encounter (Signed)
Patient called stating that she was seen in the ED on Sunday for an ear infection. Patient said that she is now deaf in the infected ear and would like to speak with a nurse. Sent to [facility]

## 2016-01-17 ENCOUNTER — Encounter: Payer: Self-pay | Admitting: Family Medicine

## 2016-01-17 ENCOUNTER — Ambulatory Visit (INDEPENDENT_AMBULATORY_CARE_PROVIDER_SITE_OTHER): Payer: BLUE CROSS/BLUE SHIELD | Admitting: Family Medicine

## 2016-01-17 VITALS — BP 138/66 | HR 120 | Temp 97.8°F | Ht 65.0 in | Wt 220.2 lb

## 2016-01-17 DIAGNOSIS — H6592 Unspecified nonsuppurative otitis media, left ear: Secondary | ICD-10-CM

## 2016-01-17 NOTE — Patient Instructions (Addendum)
Claritin (loratadine), Allegra (fexofenadine), Zyrtec (cetirizine); these are listed in order from weakest to strongest. Generic, and therefore cheaper, options are in the parentheses.   Flonase (fluticasone); nasal spray that is over the counter. 2 sprays each nostril, once daily. Aim towards the same side eye when you spray.  There are available OTC, and the generic versions, which may be cheaper, are in parentheses. Show this to a pharmacist if you have trouble finding any of these items.  Finish your course of antibiotics.

## 2016-01-17 NOTE — Progress Notes (Signed)
Pre visit review using our clinic review tool, if applicable. No additional management support is needed unless otherwise documented below in the visit note. 

## 2016-01-17 NOTE — Progress Notes (Signed)
Chief Complaint  Patient presents with  . Otitis Media    Pt went to ED sunday from ear pain/Pt loss complete sound and has been unable to hear in the LT ear    Subjective: Patient is a 30 y.o. female here for ER f/u.  She was seen on 12/24 in the emergency department for ear pain. She was diagnosed with otitis media and is on antibiotics right now. Her pain is improved, however she does have some decreased hearing in the left ear. It has improved somewhat, however she is very concerned about it. No current drainage from the ear or bleeding. She does not hear any ringing.  ROS: HEENT: As noted in HPI  Family History  Problem Relation Age of Onset  . Breast cancer Maternal Grandmother   . Diabetes Maternal Grandmother   . Sleep apnea Father   . Sleep apnea Maternal Grandfather   . Diabetes Mother   . Migraines Sister    Past Medical History:  Diagnosis Date  . Gestational diabetes   . Hypertension    with pregnancy only  . Migraine    migraines; Exedrine Migraine works   No Known Allergies  Current Outpatient Prescriptions:  .  acetaminophen (TYLENOL) 500 MG tablet, Take 1,000 mg by mouth every 6 (six) hours as needed for moderate pain., Disp: , Rfl:  .  amLODipine (NORVASC) 2.5 MG tablet, Take 1 tablet (2.5 mg total) by mouth daily., Disp: 90 tablet, Rfl: 1 .  amoxicillin-clavulanate (AUGMENTIN) 875-125 MG tablet, Take 1 tablet by mouth every 12 (twelve) hours., Disp: 14 tablet, Rfl: 0 .  HYDROcodone-acetaminophen (NORCO/VICODIN) 5-325 MG tablet, Take 1-2 tablets by mouth every 4 (four) hours as needed for moderate pain or severe pain., Disp: 20 tablet, Rfl: 0 .  ibuprofen (ADVIL,MOTRIN) 800 MG tablet, Take 1 tablet (800 mg total) by mouth 3 (three) times daily., Disp: 21 tablet, Rfl: 0 .  Multiple Vitamin (MULTIVITAMIN) tablet, Take 1 tablet by mouth daily., Disp: , Rfl:  .  Prenatal Vit-Fe Fumarate-FA (PRENATAL MULTIVITAMIN) TABS tablet, Take 1 tablet by mouth daily at 12  noon., Disp: , Rfl:   Objective: BP 138/66 (BP Location: Right Arm, Patient Position: Sitting, Cuff Size: Small)   Pulse (!) 120   Temp 97.8 F (36.6 C) (Oral)   Ht 5\' 5"  (1.651 m)   Wt 220 lb 3.2 oz (99.9 kg)   SpO2 97%   BMI 36.64 kg/m  General: Awake, appears stated age HEENT: MMM, EOMi, right ear canal is patent, TM is unremarkable. Left TM is mildly erythematous with fluid present. The canal is patent; there is are patent without discharge, no sinus tenderness, pharynx is without exudate or erythema Heart: RRR, no murmurs Lungs: CTAB, no rales, wheezes or rhonchi. No accessory muscle use Psych: Age appropriate judgment and insight, normal affect and mood  Assessment and Plan: Fluid level behind tympanic membrane of left ear  Continue antibiotics. No red flag signs. Abdomen Flonase and oral antihistamine in the meanwhile. Follow-up if symptoms stop improving. The patient voiced understanding and agreement to the plan.  Jilda Rocheicholas Paul KeyserWendling, DO 01/17/16  11:57 AM

## 2016-03-12 ENCOUNTER — Ambulatory Visit (INDEPENDENT_AMBULATORY_CARE_PROVIDER_SITE_OTHER): Payer: BLUE CROSS/BLUE SHIELD

## 2016-03-12 DIAGNOSIS — Z3049 Encounter for surveillance of other contraceptives: Secondary | ICD-10-CM | POA: Diagnosis not present

## 2016-03-12 MED ORDER — MEDROXYPROGESTERONE ACETATE 150 MG/ML IM SUSP
150.0000 mg | Freq: Once | INTRAMUSCULAR | Status: AC
  Administered 2016-03-12: 150 mg via INTRAMUSCULAR

## 2016-03-12 NOTE — Progress Notes (Signed)
Pre visit review using our clinic tool,if applicable. No additional management support is needed unless otherwise documented below in the visit note.   Patient in for Depo-Provera 150 mg Injection per order from Dr. Willow OraJose Paz. . Given IM Left deltoid per patient request.Patient tolerated well.

## 2016-03-27 ENCOUNTER — Ambulatory Visit: Payer: BLUE CROSS/BLUE SHIELD | Admitting: Internal Medicine

## 2016-04-01 ENCOUNTER — Ambulatory Visit: Payer: BLUE CROSS/BLUE SHIELD | Admitting: Internal Medicine

## 2016-04-05 ENCOUNTER — Ambulatory Visit (INDEPENDENT_AMBULATORY_CARE_PROVIDER_SITE_OTHER): Payer: BLUE CROSS/BLUE SHIELD | Admitting: Family Medicine

## 2016-04-05 ENCOUNTER — Encounter: Payer: Self-pay | Admitting: Family Medicine

## 2016-04-05 VITALS — BP 120/90 | HR 124 | Temp 98.1°F | Ht 65.0 in | Wt 220.0 lb

## 2016-04-05 DIAGNOSIS — K2900 Acute gastritis without bleeding: Secondary | ICD-10-CM

## 2016-04-05 DIAGNOSIS — E119 Type 2 diabetes mellitus without complications: Secondary | ICD-10-CM | POA: Diagnosis not present

## 2016-04-05 DIAGNOSIS — I10 Essential (primary) hypertension: Secondary | ICD-10-CM | POA: Diagnosis not present

## 2016-04-05 MED ORDER — ONDANSETRON HCL 4 MG PO TABS: 4.0000 mg | ORAL_TABLET | Freq: Three times a day (TID) | ORAL | 0 refills | Status: DC | PRN

## 2016-04-05 NOTE — Progress Notes (Signed)
Pre visit review using our clinic review tool, if applicable. No additional management support is needed unless otherwise documented below in the visit note. 

## 2016-04-05 NOTE — Patient Instructions (Signed)
Continue to push fluids, practice good hand hygiene, and cover your mouth if you gag.  Gatorade, Powerade, Pedialyte to help rehydrate.   Heat can be helpful.  Have your husband give you massages to help with aches.

## 2016-04-05 NOTE — Progress Notes (Signed)
Chief Complaint  Patient presents with  . Bodyaches    started with nausea this am,h/a     Subjective Samantha Gould is a 31 y.o. female who presents with nausea, abdominal pain, and muscle aches. Symptoms began this AM. Patient has abdominal pain, myalgias and nausea Patient denies vomiting, diarrhea, fever and cough Evaluation to date: no Sick contacts: none known  Past Medical History:  Diagnosis Date  . History of gestational diabetes   . Hypertension    with pregnancy only  . Migraine    migraines; Exedrine Migraine works   Past Surgical History:  Procedure Laterality Date  . NO PAST SURGERIES     Current Outpatient Prescriptions on File Prior to Visit  Medication Sig Dispense Refill  . amLODipine (NORVASC) 2.5 MG tablet Take 1 tablet (2.5 mg total) by mouth daily. 90 tablet 1  . ibuprofen (ADVIL,MOTRIN) 800 MG tablet Take 1 tablet (800 mg total) by mouth 3 (three) times daily. 21 tablet 0  . medroxyPROGESTERone (DEPO-PROVERA) 150 MG/ML injection Inject 150 mg into the muscle every 3 (three) months.    . Multiple Vitamin (MULTIVITAMIN) tablet Take 1 tablet by mouth daily.    . Prenatal Vit-Fe Fumarate-FA (PRENATAL MULTIVITAMIN) TABS tablet Take 1 tablet by mouth daily at 12 noon.     No Known Allergies  Review of Systems Constitutional:  No fevers or chills Ear/Nose/Mouth/Throat:  No red eyes Gastrointestinal:  As noted in the HPI Musculoskeletal/Extremities: no myalgias Integumentary (Skin/Breast): no rash  Exam BP 120/90 (BP Location: Left Arm, Patient Position: Sitting, Cuff Size: Large)   Pulse (!) 124   Temp 98.1 F (36.7 C) (Oral)   Ht 5\' 5"  (1.651 m)   Wt 220 lb (99.8 kg)   SpO2 98%   BMI 36.61 kg/m  General:  well developed, well hydrated, in no apparent distress Skin:  warm, no pallor or diaphoresis, no rashes Throat/Pharynx:  lips and gingiva without lesion; tongue and uvula midline; mucous membranes dry, non-inflamed pharynx; no exudates or  postnasal drainage Neck: neck supple without adenopathy, thyromegaly, or masses Lungs:  clear to auscultation, breath sounds equal bilaterally, no respiratory distress, no wheezes Cardio:  Tachycardic, regular rhythm without murmurs Abdomen:  abdomen soft, TTP in epigastric region; bowel sounds normal; no masses or organomegaly Extremities:  no clubbing, cyanosis, or edema Psych: Appropriate judgement/insight  Assessment and Plan  Acute gastritis, presence of bleeding unspecified, unspecified gastritis type - Plan: ondansetron (ZOFRAN) 4 MG tablet  Orders as above. Needs to push fluids.  Fluids, Acetaminophen, NSAIDs, massage, heat for aches. Avoid aggravating foods, discussed BRAT diet. F/u if symptoms fail to improve, sooner if worsening. The patient voiced understanding and agreement to the plan.  Jilda Rocheicholas Paul Le RoyWendling, DO 04/05/16  4:03 PM

## 2016-04-08 ENCOUNTER — Other Ambulatory Visit: Payer: Self-pay | Admitting: Internal Medicine

## 2016-04-08 ENCOUNTER — Encounter: Payer: Self-pay | Admitting: Internal Medicine

## 2016-05-28 ENCOUNTER — Ambulatory Visit (INDEPENDENT_AMBULATORY_CARE_PROVIDER_SITE_OTHER): Payer: BLUE CROSS/BLUE SHIELD | Admitting: Behavioral Health

## 2016-05-28 DIAGNOSIS — Z308 Encounter for other contraceptive management: Secondary | ICD-10-CM | POA: Diagnosis not present

## 2016-05-28 MED ORDER — MEDROXYPROGESTERONE ACETATE 150 MG/ML IM SUSP
150.0000 mg | Freq: Once | INTRAMUSCULAR | Status: AC
Start: 2016-05-28 — End: 2016-05-28
  Administered 2016-05-28: 150 mg via INTRAMUSCULAR

## 2016-05-28 NOTE — Progress Notes (Addendum)
Pre visit review using our clinic review tool, if applicable. No additional management support is needed unless otherwise documented below in the visit note.  Patient in clinic today for Depo-Provera injection. Patient tolerated injection well. Next appointment scheduled for 08/13/16 at 9:00 AM.  Willow OraJose Paz, MD

## 2016-06-27 ENCOUNTER — Ambulatory Visit: Payer: BLUE CROSS/BLUE SHIELD | Admitting: Family Medicine

## 2016-06-27 DIAGNOSIS — S339XXA Sprain of unspecified parts of lumbar spine and pelvis, initial encounter: Secondary | ICD-10-CM | POA: Diagnosis not present

## 2016-06-27 DIAGNOSIS — R3129 Other microscopic hematuria: Secondary | ICD-10-CM | POA: Diagnosis not present

## 2016-06-28 ENCOUNTER — Ambulatory Visit (INDEPENDENT_AMBULATORY_CARE_PROVIDER_SITE_OTHER): Payer: BLUE CROSS/BLUE SHIELD | Admitting: Internal Medicine

## 2016-06-28 ENCOUNTER — Encounter: Payer: Self-pay | Admitting: Internal Medicine

## 2016-06-28 ENCOUNTER — Telehealth: Payer: Self-pay

## 2016-06-28 VITALS — BP 134/82 | HR 79 | Temp 98.2°F | Resp 14 | Ht 65.0 in | Wt 214.1 lb

## 2016-06-28 DIAGNOSIS — M545 Low back pain, unspecified: Secondary | ICD-10-CM

## 2016-06-28 DIAGNOSIS — R319 Hematuria, unspecified: Secondary | ICD-10-CM | POA: Diagnosis not present

## 2016-06-28 DIAGNOSIS — R030 Elevated blood-pressure reading, without diagnosis of hypertension: Secondary | ICD-10-CM | POA: Diagnosis not present

## 2016-06-28 LAB — URINALYSIS, ROUTINE W REFLEX MICROSCOPIC
Bilirubin Urine: NEGATIVE
KETONES UR: NEGATIVE
Leukocytes, UA: NEGATIVE
Nitrite: NEGATIVE
PH: 6 (ref 5.0–8.0)
RBC / HPF: NONE SEEN (ref 0–?)
SPECIFIC GRAVITY, URINE: 1.025 (ref 1.000–1.030)
Total Protein, Urine: NEGATIVE
Urine Glucose: NEGATIVE
Urobilinogen, UA: 0.2 (ref 0.0–1.0)
WBC UA: NONE SEEN (ref 0–?)

## 2016-06-28 MED ORDER — PREDNISONE 10 MG PO TABS: ORAL_TABLET | ORAL | 0 refills | Status: DC

## 2016-06-28 MED ORDER — AMLODIPINE BESYLATE 2.5 MG PO TABS: 2.5000 mg | ORAL_TABLET | Freq: Every day | ORAL | 3 refills | Status: DC

## 2016-06-28 NOTE — Progress Notes (Signed)
Subjective:    Patient ID: Samantha Gould, female    DOB: [birth year], 31 y.o.   MRN: [id number]  DOS:  06/28/2016 Type of visit - description : acute Interval history: Symptoms started 10 days ago with low back pain, worse on the right. No radiation, the pain is steady throughout the day and is worse when she moves, pick up her baby or when she steps w/ the right leg. Went to urgent care, urine was checked, reportedly they saw blood, no antibiotics prescribed, not sure if they send a urine culture. He was sent home with Flexeril which she took last night and cause drowsiness and naproxen which is helping some. Also has a h/o  hypertension, ran out of amlodipine, BPs are still occasionally elevated.    Review of Systems Denies fever chills No injury or fall No lower extremity paresthesias No dysuria, gross hematuria, flank pain per se. No suprapubic pain, no vaginal discharge.  Past Medical History:  Diagnosis Date  . History of gestational diabetes   . Hypertension    with pregnancy only  . Migraine    migraines; Exedrine Migraine works    Past Surgical History:  Procedure Laterality Date  . NO PAST SURGERIES      Social History   Social History  . Marital status: Married    Spouse name: N/A  . Number of children: 1  . Years of education: N/A   Occupational History  . [employer]    Social History Main Topics  . Smoking status: Former Smoker    Quit date: 08/24/2013  . Smokeless tobacco: Never Used  . Alcohol use No  . Drug use: No  . Sexual activity: Yes    Birth control/ protection: Injection   Other Topics Concern  . Not on file   Social History Narrative   G1P1   First baby born July 2017      Allergies as of 06/28/2016   No Known Allergies     Medication List       Accurate as of 06/28/16 11:59 PM. Always use your most recent med list.          amLODipine 2.5 MG tablet Commonly known as:  NORVASC Take 1 tablet (2.5 mg total) by mouth daily.     cyclobenzaprine 10 MG tablet Commonly known as:  FLEXERIL Take 10 mg by mouth 3 (three) times daily as needed for muscle spasms.   medroxyPROGESTERone 150 MG/ML injection Commonly known as:  DEPO-PROVERA Inject 150 mg into the muscle every 3 (three) months.   multivitamin tablet Take 1 tablet by mouth daily.   naproxen 500 MG tablet Commonly known as:  NAPROSYN Take 500 mg by mouth 2 (two) times daily with a meal.   OVER THE COUNTER MEDICATION Culturelle Probiotics Digestive Health-Take 1 capsule by mouth twice daily.   predniSONE 10 MG tablet Commonly known as:  DELTASONE 4 tablets x 2 days, 3 tabs x 2 days, 2 tabs x 2 days, 1 tab x 2 days          Objective:   Physical Exam  Skin:      BP 134/82 (BP Location: Left Arm, Patient Position: Sitting, Cuff Size: Normal)   Pulse 79   Temp 98.2 F (36.8 C) (Oral)   Resp 14   Ht 5\' 5"  (1.651 m)   Wt 214 lb 2 oz (97.1 kg)   SpO2 98%   BMI 35.63 kg/m  General:   Well developed, well nourished .  NAD.  HEENT:  Normocephalic . Face symmetric, atraumatic  Abdomen: No CVA tenderness. Not distended, soft, non-tender. No rebound or rigidity.  Skin: Not pale. Not jaundice Neurologic:  alert & oriented X3.  Speech normal, gait antalgic, back pain increase with moving. Motor symmetric, DTRs symmetric. Straight leg test negative (back pain increase with right leg elevation but no radiation to the leg) Psych--  Cognition and judgment appear intact.  Cooperative with normal attention span and concentration.  Behavior appropriate. No anxious or depressed appearing.    Assessment & Plan:    Assessment Elevated BP Migraines H/o  Gestational diabetes  Plan: Back pain: Sounds mechanical in nature, no worrisome findings. Rec rest, warm compress, continue Flexeril,rx  prednisone (she is not breast-feeding), Tylenol. Call if no better Hematuria: records from the UC requested and reviewed, + microscopic hematuria, no UCX;, pt  denies  GU sx. Will check a UA, urine culture, treat if appropriate. Elevated BP: Run out of amlodipine, reports occasional high BPs. Will refer you amlodipine, continue checking,  RTC 6 months.

## 2016-06-28 NOTE — Telephone Encounter (Signed)
Medical records received, forwarded to PCP for review.

## 2016-06-28 NOTE — Patient Instructions (Signed)
GO TO THE FRONT DESK Schedule your next appointment for a  checkup in 6 months  Go back on amlodipine, we are sending a refill.   Check the  blood pressure 2 or 3 times a month  Be sure your blood pressure is between 110/65 and  140/85. If it is consistently higher or lower, let me know   Back pain: Rest Warm compresses Prednisone as prescribed Flexeril only at night Tylenol  500 mg OTC 2 tabs a day every 8 hours as needed for pain Okay naproxen if needed. If not improving in the next 2 weeks Watch your posture   Back Exercises If you have pain in your back, do these exercises 2-3 times each day or as told by your doctor. When the pain goes away, do the exercises once each day, but repeat the steps more times for each exercise (do more repetitions). If you do not have pain in your back, do these exercises once each day or as told by your doctor. Exercises Single Knee to Chest  Do these steps 3-5 times in a row for each leg: 1. Lie on your back on a firm bed or the floor with your legs stretched out. 2. Bring one knee to your chest. 3. Hold your knee to your chest by grabbing your knee or thigh. 4. Pull on your knee until you feel a gentle stretch in your lower back. 5. Keep doing the stretch for 10-30 seconds. 6. Slowly let go of your leg and straighten it.  Pelvic Tilt  Do these steps 5-10 times in a row: 1. Lie on your back on a firm bed or the floor with your legs stretched out. 2. Bend your knees so they point up to the ceiling. Your feet should be flat on the floor. 3. Tighten your lower belly (abdomen) muscles to press your lower back against the floor. This will make your tailbone point up to the ceiling instead of pointing down to your feet or the floor. 4. Stay in this position for 5-10 seconds while you gently tighten your muscles and breathe evenly.  Cat-Cow  Do these steps until your lower back bends more easily: 1. Get on your hands and knees on a firm  surface. Keep your hands under your shoulders, and keep your knees under your hips. You may put padding under your knees. 2. Let your head hang down, and make your tailbone point down to the floor so your lower back is round like the back of a cat. 3. Stay in this position for 5 seconds. 4. Slowly lift your head and make your tailbone point up to the ceiling so your back hangs low (sags) like the back of a cow. 5. Stay in this position for 5 seconds.  Press-Ups  Do these steps 5-10 times in a row: 1. Lie on your belly (face-down) on the floor. 2. Place your hands near your head, about shoulder-width apart. 3. While you keep your back relaxed and keep your hips on the floor, slowly straighten your arms to raise the top half of your body and lift your shoulders. Do not use your back muscles. To make yourself more comfortable, you may change where you place your hands. 4. Stay in this position for 5 seconds. 5. Slowly return to lying flat on the floor.  Bridges  Do these steps 10 times in a row: 1. Lie on your back on a firm surface. 2. Bend your knees so they point up to  the ceiling. Your feet should be flat on the floor. 3. Tighten your butt muscles and lift your butt off of the floor until your waist is almost as high as your knees. If you do not feel the muscles working in your butt and the back of your thighs, slide your feet 1-2 inches farther away from your butt. 4. Stay in this position for 3-5 seconds. 5. Slowly lower your butt to the floor, and let your butt muscles relax.  If this exercise is too easy, try doing it with your arms crossed over your chest. Belly Crunches  Do these steps 5-10 times in a row: 1. Lie on your back on a firm bed or the floor with your legs stretched out. 2. Bend your knees so they point up to the ceiling. Your feet should be flat on the floor. 3. Cross your arms over your chest. 4. Tip your chin a little bit toward your chest but do not bend your  neck. 5. Tighten your belly muscles and slowly raise your chest just enough to lift your shoulder blades a tiny bit off of the floor. 6. Slowly lower your chest and your head to the floor.  Back Lifts Do these steps 5-10 times in a row: 1. Lie on your belly (face-down) with your arms at your sides, and rest your forehead on the floor. 2. Tighten the muscles in your legs and your butt. 3. Slowly lift your chest off of the floor while you keep your hips on the floor. Keep the back of your head in line with the curve in your back. Look at the floor while you do this. 4. Stay in this position for 3-5 seconds. 5. Slowly lower your chest and your face to the floor.  Contact a doctor if:  Your back pain gets a lot worse when you do an exercise.  Your back pain does not lessen 2 hours after you exercise. If you have any of these problems, stop doing the exercises. Do not do them again unless your doctor says it is okay. Get help right away if:  You have sudden, very bad back pain. If this happens, stop doing the exercises. Do not do them again unless your doctor says it is okay. This information is not intended to replace advice given to you by your health care provider. Make sure you discuss any questions you have with your health care provider. Document Released: 02/09/2010 Document Revised: 06/15/2015 Document Reviewed: 03/03/2014 Elsevier Interactive Patient Education  [employer].

## 2016-06-28 NOTE — Telephone Encounter (Signed)
Records confirmed microscopic hematuria, no urine culture

## 2016-06-28 NOTE — Progress Notes (Signed)
Pre visit review using our clinic review tool, if applicable. No additional management support is needed unless otherwise documented below in the visit note. 

## 2016-06-28 NOTE — Telephone Encounter (Signed)
[facility] at [phone]. ROI sent for scanning. Awaiting records.

## 2016-06-29 LAB — URINE CULTURE: Organism ID, Bacteria: NO GROWTH

## 2016-06-30 NOTE — Assessment & Plan Note (Signed)
Back pain: Sounds mechanical in nature, no worrisome findings. Rec rest, warm compress, continue Flexeril,rx  prednisone (she is not breast-feeding), Tylenol. Call if no better Hematuria: records from the UC requested and reviewed, + microscopic hematuria, no UCX;, pt denies  GU sx. Will check a UA, urine culture, treat if appropriate. Elevated BP: Run out of amlodipine, reports occasional high BPs. Will refer you amlodipine, continue checking,  RTC 6 months.

## 2016-07-10 ENCOUNTER — Encounter: Payer: Self-pay | Admitting: Family Medicine

## 2016-07-10 ENCOUNTER — Ambulatory Visit (INDEPENDENT_AMBULATORY_CARE_PROVIDER_SITE_OTHER): Payer: BLUE CROSS/BLUE SHIELD | Admitting: Family Medicine

## 2016-07-10 VITALS — BP 120/80 | HR 86 | Temp 98.2°F | Ht 65.0 in | Wt 212.6 lb

## 2016-07-10 DIAGNOSIS — M545 Low back pain, unspecified: Secondary | ICD-10-CM

## 2016-07-10 MED ORDER — TRAMADOL HCL 50 MG PO TABS: 50.0000 mg | ORAL_TABLET | Freq: Two times a day (BID) | ORAL | 0 refills | Status: DC | PRN

## 2016-07-10 MED ORDER — NAPROXEN 500 MG PO TABS: 500.0000 mg | ORAL_TABLET | Freq: Two times a day (BID) | ORAL | 0 refills | Status: DC

## 2016-07-10 NOTE — Patient Instructions (Signed)
Only use tramadol for breakthrough pain.  Let us know immediately if you have loss of bowel or bladder control again.  EXERCISES  RANGE OF MOTION (ROM) AND STRETCHING EXERCISES - Low Back pain Most people with lower back pain will find that their symptoms get worse with excessive bending forward (flexion) or arching at the lower back (extension). The exercises that will help resolve your symptoms will focus on the opposite motion.  Your physician, physical therapist or athletic trainer will help you determine which exercises will be most helpful to resolve your lower back pain. Do not complete any exercises without first consulting with your caregiver. Discontinue any exercises which make your symptoms worse, until you speak to your caregiver. If you have pain, numbness or tingling which travels down into your buttocks, leg or foot, the goal of the therapy is for these symptoms to move closer to your back and eventually resolve. Sometimes, these leg symptoms will get better, but your lower back pain may worsen. This is often an indication of progress in your rehabilitation. Be very alert to any changes in your symptoms and the activities in which you participated in the 24 hours prior to the change. Sharing this information with your caregiver will allow him or her to most efficiently treat your condition. These exercises may help you when beginning to rehabilitate your injury. Your symptoms may resolve with or without further involvement from your physician, physical therapist or athletic trainer. While completing these exercises, remember:   Restoring tissue flexibility helps normal motion to return to the joints. This allows healthier, less painful movement and activity.  An effective stretch should be held for at least 30 seconds.  A stretch should never be painful. You should only feel a gentle lengthening or release in the stretched tissue. FLEXION RANGE OF MOTION AND STRETCHING  EXERCISES:  STRETCH - Flexion, Single Knee to Chest   Lie on a firm bed or floor with both legs extended in front of you.  Keeping one leg in contact with the floor, bring your opposite knee to your chest. Hold your leg in place by either grabbing behind your thigh or at your knee.  Pull until you feel a gentle stretch in your low back. Hold 15-20 seconds.  Slowly release your grasp and repeat the exercise with the opposite side. Repeat 2 times. Complete this exercise 1-2 times per day.   STRETCH - Flexion, Double Knee to Chest  Lie on a firm bed or floor with both legs extended in front of you.  Keeping one leg in contact with the floor, bring your opposite knee to your chest.  Tense your stomach muscles to support your back and then lift your other knee to your chest. Hold your legs in place by either grabbing behind your thighs or at your knees.  Pull both knees toward your chest until you feel a gentle stretch in your low back. Hold 15-20 seconds.  Tense your stomach muscles and slowly return one leg at a time to the floor. Repeat 2 times. Complete this exercise 1-2 times per day.   STRETCH - Low Trunk Rotation  Lie on a firm bed or floor. Keeping your legs in front of you, bend your knees so they are both pointed toward the ceiling and your feet are flat on the floor.  Extend your arms out to the side. This will stabilize your upper body by keeping your shoulders in contact with the floor.  Gently and slowly drop both  knees together to one side until you feel a gentle stretch in your low back. Hold for 15-20 seconds.  Tense your stomach muscles to support your lower back as you bring your knees back to the starting position. Repeat the exercise to the other side. Repeat 2 times. Complete this exercise 1-2 times per day  EXTENSION RANGE OF MOTION AND FLEXIBILITY EXERCISES:  STRETCH - Extension, Prone on Elbows   Lie on your stomach on the floor, a bed will be too soft.  Place your palms about shoulder width apart and at the height of your head.  Place your elbows under your shoulders. If this is too painful, stack pillows under your chest.  Allow your body to relax so that your hips drop lower and make contact more completely with the floor.  Hold this position for 15-20 seconds.  Slowly return to lying flat on the floor. Repeat 2 times. Complete this exercise 1-2 times per day.   RANGE OF MOTION - Extension, Prone Press Ups  Lie on your stomach on the floor, a bed will be too soft. Place your palms about shoulder width apart and at the height of your head.  Keeping your back as relaxed as possible, slowly straighten your elbows while keeping your hips on the floor. You may adjust the placement of your hands to maximize your comfort. As you gain motion, your hands will come more underneath your shoulders.  Hold this position 15-20 seconds.  Slowly return to lying flat on the floor. Repeat 2 times. Complete this exercise 1-2 times per day.   RANGE OF MOTION- Quadruped, Neutral Spine   Assume a hands and knees position on a firm surface. Keep your hands under your shoulders and your knees under your hips. You may place padding under your knees for comfort.  Drop your head and point your tailbone toward the ground below you. This will round out your lower back like an angry cat. Hold this position for 15-20 seconds.  Slowly lift your head and release your tail bone so that your back sags into a large arch, like an old horse.  Hold this position for 15-20 seconds.  Repeat this until you feel limber in your low back.  Now, find your "sweet spot." This will be the most comfortable position somewhere between the two previous positions. This is your neutral spine. Once you have found this position, tense your stomach muscles to support your low back.  Hold this position for 15-20 seconds. Repeat 2 times. Complete this exercise 1-2 times per day.   STRENGTHENING EXERCISES - Low Back Sprain These exercises may help you when beginning to rehabilitate your injury. These exercises should be done near your "sweet spot." This is the neutral, low-back arch, somewhere between fully rounded and fully arched, that is your least painful position. When performed in this safe range of motion, these exercises can be used for people who have either a flexion or extension based injury. These exercises may resolve your symptoms with or without further involvement from your physician, physical therapist or athletic trainer. While completing these exercises, remember:   Muscles can gain both the endurance and the strength needed for everyday activities through controlled exercises.  Complete these exercises as instructed by your physician, physical therapist or athletic trainer. Increase the resistance and repetitions only as guided.  You may experience muscle soreness or fatigue, but the pain or discomfort you are trying to eliminate should never worsen during these exercises. If this pain  does worsen, stop and make certain you are following the directions exactly. If the pain is still present after adjustments, discontinue the exercise until you can discuss the trouble with your caregiver.  STRENGTHENING - Deep Abdominals, Pelvic Tilt   Lie on a firm bed or floor. Keeping your legs in front of you, bend your knees so they are both pointed toward the ceiling and your feet are flat on the floor.  Tense your lower abdominal muscles to press your low back into the floor. This motion will rotate your pelvis so that your tail bone is scooping upwards rather than pointing at your feet or into the floor. With a gentle tension and even breathing, hold this position for 10-15 seconds. Repeat 2 times. Complete this exercise 1 time per day.   STRENGTHENING - Abdominals, Crunches   Lie on a firm bed or floor. Keeping your legs in front of you, bend your knees so they  are both pointed toward the ceiling and your feet are flat on the floor. Cross your arms over your chest.  Slightly tip your chin down without bending your neck.  Tense your abdominals and slowly lift your trunk high enough to just clear your shoulder blades. Lifting higher can put excessive stress on the lower back and does not further strengthen your abdominal muscles.  Control your return to the starting position. Repeat 2 times. Complete this exercise once every 1-2 days.   STRENGTHENING - Quadruped, Opposite UE/LE Lift   Assume a hands and knees position on a firm surface. Keep your hands under your shoulders and your knees under your hips. You may place padding under your knees for comfort.  Find your neutral spine and gently tense your abdominal muscles so that you can maintain this position. Your shoulders and hips should form a rectangle that is parallel with the floor and is not twisted.  Keeping your trunk steady, lift your right hand no higher than your shoulder and then your left leg no higher than your hip. Make sure you are not holding your breath. Hold this position for 15-20 seconds.  Continuing to keep your abdominal muscles tense and your back steady, slowly return to your starting position. Repeat with the opposite arm and leg. Repeat 2 times. Complete this exercise once every 1-2 days.   STRENGTHENING - Abdominals and Quadriceps, Straight Leg Raise   Lie on a firm bed or floor with both legs extended in front of you.  Keeping one leg in contact with the floor, bend the other knee so that your foot can rest flat on the floor.  Find your neutral spine, and tense your abdominal muscles to maintain your spinal position throughout the exercise.  Slowly lift your straight leg off the floor about 6 inches for a count of 15, making sure to not hold your breath.  Still keeping your neutral spine, slowly lower your leg all the way to the floor. Repeat this exercise with each  leg 2 times. Complete this exercise once every 1-2 days. POSTURE AND BODY MECHANICS CONSIDERATIONS - Low Back Sprain Keeping correct posture when sitting, standing or completing your activities will reduce the stress put on different body tissues, allowing injured tissues a chance to heal and limiting painful experiences. The following are general guidelines for improved posture. Your physician or physical therapist will provide you with any instructions specific to your needs. While reading these guidelines, remember:  The exercises prescribed by your provider will help you have the flexibility  and strength to maintain correct postures.  The correct posture provides the best environment for your joints to work. All of your joints have less wear and tear when properly supported by a spine with good posture. This means you will experience a healthier, less painful body.  Correct posture must be practiced with all of your activities, especially prolonged sitting and standing. Correct posture is as important when doing repetitive low-stress activities (typing) as it is when doing a single heavy-load activity (lifting).  RESTING POSITIONS Consider which positions are most painful for you when choosing a resting position. If you have pain with flexion-based activities (sitting, bending, stooping, squatting), choose a position that allows you to rest in a less flexed posture. You would want to avoid curling into a fetal position on your side. If your pain worsens with extension-based activities (prolonged standing, working overhead), avoid resting in an extended position such as sleeping on your stomach. Most people will find more comfort when they rest with their spine in a more neutral position, neither too rounded nor too arched. Lying on a non-sagging bed on your side with a pillow between your knees, or on your back with a pillow under your knees will often provide some relief. Keep in mind, being in any  one position for a prolonged period of time, no matter how correct your posture, can still lead to stiffness. PROPER SITTING POSTURE In order to minimize stress and discomfort on your spine, you must sit with correct posture. Sitting with good posture should be effortless for a healthy body. Returning to good posture is a gradual process. Many people can work toward this most comfortably by using various supports until they have the flexibility and strength to maintain this posture on their own. When sitting with proper posture, your ears will fall over your shoulders and your shoulders will fall over your hips. You should use the back of the chair to support your upper back. Your lower back will be in a neutral position, just slightly arched. You may place a small pillow or folded towel at the base of your lower back for  support.  When working at a desk, create an environment that supports good, upright posture. Without extra support, muscles tire, which leads to excessive strain on joints and other tissues. Keep these recommendations in mind:  CHAIR:  A chair should be able to slide under your desk when your back makes contact with the back of the chair. This allows you to work closely.  The chair's height should allow your eyes to be level with the upper part of your monitor and your hands to be slightly lower than your elbows.  BODY POSITION  Your feet should make contact with the floor. If this is not possible, use a foot rest.  Keep your ears over your shoulders. This will reduce stress on your neck and low back.  INCORRECT SITTING POSTURES  If you are feeling tired and unable to assume a healthy sitting posture, do not slouch or slump. This puts excessive strain on your back tissues, causing more damage and pain. Healthier options include:  Using more support, like a lumbar pillow.  Switching tasks to something that requires you to be upright or walking.  Talking a brief  walk.  Lying down to rest in a neutral-spine position.  PROLONGED STANDING WHILE SLIGHTLY LEANING FORWARD  When completing a task that requires you to lean forward while standing in one place for a long time, place either  foot up on a stationary 2-4 inch high object to help maintain the best posture. When both feet are on the ground, the lower back tends to lose its slight inward curve. If this curve flattens (or becomes too large), then the back and your other joints will experience too much stress, tire more quickly, and can cause pain.  CORRECT STANDING POSTURES Proper standing posture should be assumed with all daily activities, even if they only take a few moments, like when brushing your teeth. As in sitting, your ears should fall over your shoulders and your shoulders should fall over your hips. You should keep a slight tension in your abdominal muscles to brace your spine. Your tailbone should point down to the ground, not behind your body, resulting in an over-extended swayback posture.   INCORRECT STANDING POSTURES  Common incorrect standing postures include a forward head, locked knees and/or an excessive swayback. WALKING Walk with an upright posture. Your ears, shoulders and hips should all line-up.  PROLONGED ACTIVITY IN A FLEXED POSITION When completing a task that requires you to bend forward at your waist or lean over a low surface, try to find a way to stabilize 3 out of 4 of your limbs. You can place a hand or elbow on your thigh or rest a knee on the surface you are reaching across. This will provide you more stability, so that your muscles do not tire as quickly. By keeping your knees relaxed, or slightly bent, you will also reduce stress across your lower back. CORRECT LIFTING TECHNIQUES  DO :  Assume a wide stance. This will provide you more stability and the opportunity to get as close as possible to the object which you are lifting.  Tense your abdominals to brace your  spine. Bend at the knees and hips. Keeping your back locked in a neutral-spine position, lift using your leg muscles. Lift with your legs, keeping your back straight.  Test the weight of unknown objects before attempting to lift them.  Try to keep your elbows locked down at your sides in order get the best strength from your shoulders when carrying an object.     Always ask for help when lifting heavy or awkward objects. INCORRECT LIFTING TECHNIQUES DO NOT:   Lock your knees when lifting, even if it is a small object.  Bend and twist. Pivot at your feet or move your feet when needing to change directions.  Assume that you can safely pick up even a paperclip without proper posture.

## 2016-07-10 NOTE — Progress Notes (Signed)
Musculoskeletal Exam  Patient: Samantha Gould DOB: Mar 10, 1985  DOS: 07/10/2016  SUBJECTIVE:  Chief Complaint:   Chief Complaint  Patient presents with  . Back Pain    follow-up-pt states the pain is about the same    Samantha Gould is a 31 y.o.  female for evaluation and treatment of his back pain.   Onset:  3 weeks ago. 3 hour dance practices could have exacerbated things.  Location: lower Character:  sharp  Progression of issue:  is unchanged Associated symptoms: decreased ROM Denies bowel/bladder incontinence or weakness Treatment: to date has been steroids.   Neurovascular symptoms: no  ROS: Gastrointestinal: +bowel incontinence Genitourinary: No bladder incontinence Musculoskeletal/Extremities: +back pain Neurologic: no numbness, tingling no weakness   Past Medical History:  Diagnosis Date  . History of gestational diabetes   . Hypertension    with pregnancy only  . Migraine    migraines; Exedrine Migraine works   Past Surgical History:  Procedure Laterality Date  . NO PAST SURGERIES     Family History  Problem Relation Age of Onset  . Breast cancer Maternal Grandmother   . Diabetes Maternal Grandmother   . Sleep apnea Father   . Sleep apnea Maternal Grandfather   . Diabetes Mother   . Migraines Sister    Current Outpatient Prescriptions  Medication Sig Dispense Refill  . amLODipine (NORVASC) 2.5 MG tablet Take 1 tablet (2.5 mg total) by mouth daily. 90 tablet 3  . cyclobenzaprine (FLEXERIL) 10 MG tablet Take 10 mg by mouth 3 (three) times daily as needed for muscle spasms.    . medroxyPROGESTERone (DEPO-PROVERA) 150 MG/ML injection Inject 150 mg into the muscle every 3 (three) months.    . Multiple Vitamin (MULTIVITAMIN) tablet Take 1 tablet by mouth daily.    . naproxen (NAPROSYN) 500 MG tablet Take 500 mg by mouth 2 (two) times daily with a meal.    . OVER THE COUNTER MEDICATION Culturelle Probiotics Digestive Health-Take 1 capsule by mouth  twice daily.     No Known Allergies Social History   Social History  . Marital status: Married   Occupational History  . [employer]    Social History Main Topics  . Smoking status: Former Smoker    Quit date: 08/24/2013  . Smokeless tobacco: Never Used  . Alcohol use No  . Drug use: No  . Sexual activity: Yes    Birth control/ protection: Injection   Social History Narrative   G1P1   First baby born July 2017    Objective:  VITAL SIGNS: BP 120/80 (BP Location: Left Arm, Patient Position: Sitting, Cuff Size: Normal)   Pulse 86   Temp 98.2 F (36.8 C) (Oral)   Ht 5\' 5"  (1.651 m)   Wt 212 lb 9.6 oz (96.4 kg)   SpO2 98%   BMI 35.38 kg/m  Constitutional: Well formed, well developed. No acute distress. HENT: Normocephalic, atraumatic.  Moist mucous membranes.  Eyes:  EOM grossly intact. Conjunctiva normal.  Neck:  Full range of motion.   Cardiovascular: RRR, no murmurs Thorax & Lungs:  CTAB, no wheezing or rales  Extremities: No clubbing. No cyanosis. No edema.  Skin: Warm. Dry. No erythema. No rash.  Musculoskeletal: low back.   Tenderness to palpation: Yes over lumbar paraspinal musculature Deformity: no Ecchymosis: no Straight leg test: negative for Neurologic: Normal sensory function. No focal deficits noted. DTR's equal and symmetry in LE's. No clonus. Psychiatric: Normal mood. Age appropriate judgment and insight. Alert & oriented  x 3.    Assessment:  Acute bilateral low back pain without sciatica - Plan: naproxen (NAPROSYN) 500 MG tablet, traMADol (ULTRAM) 50 MG tablet  Plan: Orders as above. Routine NSAID to get ahead of inflammation. Tramadol for breakthrough pain.  Given the fact that the episode of incontinence happened almost 2 weeks ago, likely not cauda equina. Heat. Stretches/exercises for LBP. Very tight, likely tight hip flexors also given her sitting at work.   Yoga may be helpful after this issue has passed. She reaffirmed she is not breast  feeding. F/u in 2 weeks w reg PCP if no improvement.  The patient voiced understanding and agreement to the plan.  Jilda Roche Nord, DO 07/10/16  8:33 AM

## 2016-07-23 ENCOUNTER — Encounter: Payer: Self-pay | Admitting: Medical

## 2016-07-23 ENCOUNTER — Ambulatory Visit (INDEPENDENT_AMBULATORY_CARE_PROVIDER_SITE_OTHER): Payer: BLUE CROSS/BLUE SHIELD | Admitting: Medical

## 2016-07-23 VITALS — BP 129/96 | HR 112 | Temp 98.0°F | Resp 16 | Ht 65.0 in | Wt 212.4 lb

## 2016-07-23 DIAGNOSIS — M791 Myalgia, unspecified site: Secondary | ICD-10-CM

## 2016-07-23 DIAGNOSIS — J029 Acute pharyngitis, unspecified: Secondary | ICD-10-CM

## 2016-07-23 LAB — POCT RAPID STREP A (OFFICE): RAPID STREP A SCREEN: NEGATIVE

## 2016-07-23 MED ORDER — AMOXICILLIN 875 MG PO TABS: 875.0000 mg | ORAL_TABLET | Freq: Two times a day (BID) | ORAL | 0 refills | Status: DC

## 2016-07-23 NOTE — Patient Instructions (Addendum)
Your strep test was negative. However, your physical exam and clinical presentation is suspicious for strep and it is important to note that rapid strep test can be falsely negative. So I am going to give you amoxicillin antibiotic today based on your exam and clinical presentation.  For body aches, ha and fever use ibuprofen 600-800 mg every 8 hours.  Expect you will feel significantly better within 2 days if not let us know.  Rest hydrate, tylenol for fever, and warm salt water gargles.   Follow up in 7 days or as needed.

## 2016-07-23 NOTE — Progress Notes (Signed)
   Subjective:    Patient ID: Samantha Gould, female    DOB: 12-06-1985, 31 y.o.   MRN: [id number]  HPI   Pt in just got sick yesterday. St when swallowing and eating. Hurts to swallow even her saliva.Pt temp was 103.3 yesterday.  Pt had diffuse bodyaches and achiness. Trouble sleeping with body aches.  Pt works for [employer]. Pt child sick on Friday with ear infection. Child on antibiotic.  LMP- on depopovera.   Review of Systems  Constitutional: Positive for fever. Negative for chills and fatigue.  HENT: Positive for sore throat. Negative for congestion, hearing loss, postnasal drip, sinus pain and sinus pressure.   Respiratory: Negative for cough, chest tightness and wheezing.   Cardiovascular: Negative for chest pain and palpitations.  Gastrointestinal: Negative for abdominal pain, constipation, nausea and vomiting.  Musculoskeletal: Positive for myalgias. Negative for back pain, neck pain and neck stiffness.  Skin: Negative for rash.  Neurological: Positive for headaches. Negative for dizziness, seizures, syncope and weakness.       Faint ha.  Hematological: Negative for adenopathy. Does not bruise/bleed easily.  Psychiatric/Behavioral: Negative for behavioral problems and confusion.       Objective:   Physical Exam  General  Mental Status - Alert. General Appearance - Well groomed. Not in acute distress.  Skin Rashes- No Rashes.  HEENT Head- Normal. Ear Auditory Canal - Left- Normal. Right - Normal.Tympanic Membrane- Left- Normal. Right- Normal. Eye Sclera/Conjunctiva- Left- Normal. Right- Normal. Nose & Sinuses Nasal Mucosa- Left-  Boggy and Congested. Right-  Boggy and  Congested.Bilateral  No maxillary and no  frontal sinus pressure. Mouth & Throat Lips: Upper Lip- Normal: no dryness, cracking, pallor, cyanosis, or vesicular eruption. Lower Lip-Normal: no dryness, cracking, pallor, cyanosis or vesicular eruption. Buccal Mucosa- Bilateral- No Aphthous  ulcers. Oropharynx- No Discharge or Erythema. Tonsils: Characteristics- Bilateral-  Erythema and Congestion. Size/Enlargement- Bilateral- 1-2 + enlargement. Discharge- bilateral-None.  Neck Neck- Supple. No Masses. Mild tender submandibular node   Chest and Lung Exam Auscultation: Breath Sounds:-Clear even and unlabored.  Cardiovascular Auscultation:Rythm- Regular, rate and rhythm. Murmurs & Other Heart Sounds:Ausculatation of the heart reveal- No Murmurs.  Lymphatic Head & Neck General Head & Neck Lymphatics: Bilateral: Description- No Localized lymphadenopathy.  Neuro CN III-XII grossly intact. No gross motor or sensory function deficits.     Assessment & Plan:  Your strep test was negative. However, your physical exam and clinical presentation is suspicious for strep and it is important to note that rapid strep test can be falsely negative. So I am going to give you amoxicillin antibiotic today based on your exam and clinical presentation.  For bodyaches, ha and fever use ibuprofen 600-800 mg every 8 hours.  Expect you will feel significantly better within 2 days if not let us know.  Rest hydrate, tylenol for fever, and warm salt water gargles.   Follow up in 7 days or as needed.

## 2016-08-13 ENCOUNTER — Ambulatory Visit (INDEPENDENT_AMBULATORY_CARE_PROVIDER_SITE_OTHER): Payer: BLUE CROSS/BLUE SHIELD

## 2016-08-13 ENCOUNTER — Ambulatory Visit: Payer: BLUE CROSS/BLUE SHIELD

## 2016-08-13 DIAGNOSIS — Z3042 Encounter for surveillance of injectable contraceptive: Secondary | ICD-10-CM

## 2016-08-13 MED ORDER — MEDROXYPROGESTERONE ACETATE 150 MG/ML IM SUSP
150.0000 mg | Freq: Once | INTRAMUSCULAR | Status: AC
Start: 2016-08-13 — End: 2016-08-13
  Administered 2016-08-13: 150 mg via INTRAMUSCULAR

## 2016-08-13 NOTE — Progress Notes (Signed)
Pre visit review using our clinic tool,if applicable. No additional management support is needed unless otherwise documented below in the visit note.   Patient in for Depo-Provera injection per order from Dr. Tenna ChildJ Paz. Patient given 150 mg IM left deltoid per patient request. Patietn tolerated well.  Return appointment scheduled for 10/29/16.

## 2016-08-13 NOTE — Progress Notes (Signed)
Error

## 2016-10-07 DIAGNOSIS — R634 Abnormal weight loss: Secondary | ICD-10-CM | POA: Diagnosis not present

## 2016-10-07 DIAGNOSIS — I10 Essential (primary) hypertension: Secondary | ICD-10-CM | POA: Diagnosis not present

## 2016-10-07 DIAGNOSIS — E119 Type 2 diabetes mellitus without complications: Secondary | ICD-10-CM | POA: Diagnosis not present

## 2016-10-29 ENCOUNTER — Ambulatory Visit (INDEPENDENT_AMBULATORY_CARE_PROVIDER_SITE_OTHER): Payer: BLUE CROSS/BLUE SHIELD

## 2016-10-29 DIAGNOSIS — Z308 Encounter for other contraceptive management: Secondary | ICD-10-CM

## 2016-10-29 MED ORDER — MEDROXYPROGESTERONE ACETATE 150 MG/ML IM SUSP
150.0000 mg | Freq: Once | INTRAMUSCULAR | Status: AC
  Administered 2016-10-29: 150 mg via INTRAMUSCULAR

## 2016-12-30 ENCOUNTER — Ambulatory Visit: Payer: BLUE CROSS/BLUE SHIELD | Admitting: Internal Medicine

## 2017-01-13 DIAGNOSIS — J029 Acute pharyngitis, unspecified: Secondary | ICD-10-CM | POA: Diagnosis not present

## 2017-01-15 ENCOUNTER — Ambulatory Visit: Payer: BLUE CROSS/BLUE SHIELD | Admitting: Internal Medicine

## 2017-01-15 ENCOUNTER — Encounter: Payer: Self-pay | Admitting: Internal Medicine

## 2017-01-15 VITALS — BP 139/96 | HR 96 | Temp 98.1°F | Resp 14 | Ht 66.0 in | Wt 216.0 lb

## 2017-01-15 DIAGNOSIS — Z3042 Encounter for surveillance of injectable contraceptive: Secondary | ICD-10-CM

## 2017-01-15 DIAGNOSIS — J069 Acute upper respiratory infection, unspecified: Secondary | ICD-10-CM

## 2017-01-15 DIAGNOSIS — Z0000 Encounter for general adult medical examination without abnormal findings: Secondary | ICD-10-CM

## 2017-01-15 DIAGNOSIS — I10 Essential (primary) hypertension: Secondary | ICD-10-CM

## 2017-01-15 DIAGNOSIS — Z8632 Personal history of gestational diabetes: Secondary | ICD-10-CM | POA: Insufficient documentation

## 2017-01-15 DIAGNOSIS — O09299 Supervision of pregnancy with other poor reproductive or obstetric history, unspecified trimester: Secondary | ICD-10-CM | POA: Insufficient documentation

## 2017-01-15 LAB — BASIC METABOLIC PANEL
BUN: 9 mg/dL (ref 6–23)
CO2: 27 mEq/L (ref 19–32)
Calcium: 9.5 mg/dL (ref 8.4–10.5)
Chloride: 103 mEq/L (ref 96–112)
Creatinine, Ser: 0.74 mg/dL (ref 0.40–1.20)
GFR: 116.79 mL/min (ref >60.00)
Glucose, Bld: 108 mg/dL — ABNORMAL HIGH (ref 70–99)
POTASSIUM: 3.8 meq/L (ref 3.5–5.1)
SODIUM: 137 meq/L (ref 135–145)

## 2017-01-15 LAB — CBC WITH DIFFERENTIAL/PLATELET
Basophils Absolute: 0 10*3/uL (ref 0.0–0.1)
Basophils Relative: 0.5 % (ref 0.0–3.0)
EOS PCT: 2.3 % (ref 0.0–5.0)
Eosinophils Absolute: 0.2 10*3/uL (ref 0.0–0.7)
HCT: 45 % (ref 36.0–46.0)
HEMOGLOBIN: 14.9 g/dL (ref 12.0–15.0)
Lymphocytes Relative: 21.9 % (ref 12.0–46.0)
Lymphs Abs: 2 10*3/uL (ref 0.7–4.0)
MCHC: 33 g/dL (ref 30.0–36.0)
MCV: 91.1 fl (ref 78.0–100.0)
MONOS PCT: 8.8 % (ref 3.0–12.0)
Monocytes Absolute: 0.8 10*3/uL (ref 0.1–1.0)
Neutro Abs: 6 10*3/uL (ref 1.4–7.7)
Neutrophils Relative %: 66.5 % (ref 43.0–77.0)
Platelets: 299 10*3/uL (ref 150.0–400.0)
RBC: 4.95 Mil/uL (ref 3.87–5.11)
RDW: 13.3 % (ref 11.5–15.5)
WBC: 9 10*3/uL (ref 4.0–10.5)

## 2017-01-15 MED ORDER — AZELASTINE HCL 0.1 % NA SOLN: 2.0000 | Freq: Every evening | NASAL | 3 refills | Status: DC | PRN

## 2017-01-15 MED ORDER — AMLODIPINE BESYLATE 2.5 MG PO TABS: 2.5000 mg | ORAL_TABLET | Freq: Every day | ORAL | 6 refills | Status: DC

## 2017-01-15 MED ORDER — MEDROXYPROGESTERONE ACETATE 150 MG/ML IM SUSP
150.0000 mg | Freq: Once | INTRAMUSCULAR | Status: AC
  Administered 2017-01-15: 150 mg via INTRAMUSCULAR

## 2017-01-15 NOTE — Progress Notes (Signed)
Pre visit review using our clinic review tool, if applicable. No additional management support is needed unless otherwise documented below in the visit note. 

## 2017-01-15 NOTE — Patient Instructions (Signed)
GO TO THE LAB : Get the blood work     GO TO THE FRONT DESK Schedule your next appointment for a physical exam in 3 months  Go back on amlodipine.  Low-salt diet !  Check the  blood pressure  weekly   Be sure your blood pressure is between 110/65 and  135/85. If it is consistently higher or lower, let me know  For the cold: Tylenol Robitussin-DM Flonase 2 sprays on each side of the nose daily Astelin 2 sprays on the side of the nose at night, prescription sent. Avoid decongestants Call if not gradually improving   Low-Sodium Eating Plan Sodium, which is an element that makes up salt, helps you maintain a healthy balance of fluids in your body. Too much sodium can increase your blood pressure and cause fluid and waste to be held in your body. Your health care provider or dietitian may recommend following this plan if you have high blood pressure (hypertension), kidney disease, liver disease, or heart failure. Eating less sodium can help lower your blood pressure, reduce swelling, and protect your heart, liver, and kidneys. What are tips for following this plan? General guidelines  Most people on this plan should limit their sodium intake to 1,500-2,000 mg (milligrams) of sodium each day. Reading food labels  The Nutrition Facts label lists the amount of sodium in one serving of the food. If you eat more than one serving, you must multiply the listed amount of sodium by the number of servings.  Choose foods with less than 140 mg of sodium per serving.  Avoid foods with 300 mg of sodium or more per serving. Shopping  Look for lower-sodium products, often labeled as "low-sodium" or "no salt added."  Always check the sodium content even if foods are labeled as "unsalted" or "no salt added".  Buy fresh foods. ? Avoid canned foods and premade or frozen meals. ? Avoid canned, cured, or processed meats  Buy breads that have less than 80 mg of sodium per slice. Cooking  Eat more  home-cooked food and less restaurant, buffet, and fast food.  Avoid adding salt when cooking. Use salt-free seasonings or herbs instead of table salt or sea salt. Check with your health care provider or pharmacist before using salt substitutes.  Cook with plant-based oils, such as canola, sunflower, or olive oil. Meal planning  When eating at a restaurant, ask that your food be prepared with less salt or no salt, if possible.  Avoid foods that contain MSG (monosodium glutamate). MSG is sometimes added to [place]inese food, bouillon, and some canned foods. What foods are recommended? The items listed may not be a complete list. Talk with your dietitian about what dietary choices are best for you. Grains Low-sodium cereals, including oats, puffed wheat and rice, and shredded wheat. Low-sodium crackers. Unsalted rice. Unsalted pasta. Low-sodium bread. Whole-grain breads and whole-grain pasta. Vegetables Fresh or frozen vegetables. "No salt added" canned vegetables. "No salt added" tomato sauce and paste. Low-sodium or reduced-sodium tomato and vegetable juice. Fruits Fresh, frozen, or canned fruit. Fruit juice. Meats and other protein foods Fresh or frozen (no salt added) meat, poultry, seafood, and fish. Low-sodium canned tuna and salmon. Unsalted nuts. Dried peas, beans, and lentils without added salt. Unsalted canned beans. Eggs. Unsalted nut butters. Dairy Milk. Soy milk. Cheese that is naturally low in sodium, such as ricotta cheese, fresh mozzarella, or Swiss cheese Low-sodium or reduced-sodium cheese. Cream cheese. Yogurt. Fats and oils Unsalted butter. Unsalted margarine with  no trans fat. Vegetable oils such as canola or olive oils. Seasonings and other foods Fresh and dried herbs and spices. Salt-free seasonings. Low-sodium mustard and ketchup. Sodium-free salad dressing. Sodium-free light mayonnaise. Fresh or refrigerated horseradish. Lemon juice. Vinegar. Homemade, reduced-sodium, or  low-sodium soups. Unsalted popcorn and pretzels. Low-salt or salt-free chips. What foods are not recommended? The items listed may not be a complete list. Talk with your dietitian about what dietary choices are best for you. Grains Instant hot cereals. Bread stuffing, pancake, and biscuit mixes. Croutons. Seasoned rice or pasta mixes. Noodle soup cups. Boxed or frozen macaroni and cheese. Regular salted crackers. Self-rising flour. Vegetables Sauerkraut, pickled vegetables, and relishes. Olives. [place] fries. Onion rings. Regular canned vegetables (not low-sodium or reduced-sodium). Regular canned tomato sauce and paste (not low-sodium or reduced-sodium). Regular tomato and vegetable juice (not low-sodium or reduced-sodium). Frozen vegetables in sauces. Meats and other protein foods Meat or fish that is salted, canned, smoked, spiced, or pickled. Bacon, ham, sausage, hotdogs, corned beef, chipped beef, packaged lunch meats, salt pork, jerky, pickled herring, anchovies, regular canned tuna, sardines, salted nuts. Dairy Processed cheese and cheese spreads. Cheese curds. Blue cheese. Feta cheese. String cheese. Regular cottage cheese. Buttermilk. Canned milk. Fats and oils Salted butter. Regular margarine. Ghee. Bacon fat. Seasonings and other foods Onion salt, garlic salt, seasoned salt, table salt, and sea salt. Canned and packaged gravies. Worcestershire sauce. Tartar sauce. Barbecue sauce. Teriyaki sauce. Soy sauce, including reduced-sodium. Steak sauce. Fish sauce. Oyster sauce. Cocktail sauce. Horseradish that you find on the shelf. Regular ketchup and mustard. Meat flavorings and tenderizers. Bouillon cubes. Hot sauce and Tabasco sauce. Premade or packaged marinades. Premade or packaged taco seasonings. Relishes. Regular salad dressings. Salsa. Potato and tortilla chips. Corn chips and puffs. Salted popcorn and pretzels. Canned or dried soups. Pizza. Frozen entrees and pot pies. Summary  Eating  less sodium can help lower your blood pressure, reduce swelling, and protect your heart, liver, and kidneys.  Most people on this plan should limit their sodium intake to 1,500-2,000 mg (milligrams) of sodium each day.  Canned, boxed, and frozen foods are high in sodium. Restaurant foods, fast foods, and pizza are also very high in sodium. You also get sodium by adding salt to food.  Try to cook at home, eat more fresh fruits and vegetables, and eat less fast food, canned, processed, or prepared foods. This information is not intended to replace advice given to you by your health care provider. Make sure you discuss any questions you have with your health care provider. Document Released: 06/29/2001 Document Revised: 01/01/2016 Document Reviewed: 01/01/2016 Elsevier Interactive Patient Education  [employer]2018 [employer] Inc[employer]

## 2017-01-15 NOTE — Assessment & Plan Note (Signed)
Td 2016 To see gyn 01/2017

## 2017-01-15 NOTE — Progress Notes (Signed)
Subjective:    Patient ID: Samantha Gould, female    DOB: 14-Mar-1985, 31 y.o.   MRN: [id number]  DOS:  01/15/2017 Type of visit - description : f/u  Interval history: Elevated BP: Currently taking no medications, ambulatory BPs 125, 130 with diastolic of 90 or more. URI, went to urgent care a couple of days ago, rapid strep test negative, throat culture pending.  Still having symptoms mostly nose congestion, dry feeling at the throat.  Has taken some decongestants.  BP Readings from Last 3 Encounters:  01/15/17 (!) 139/96  07/23/16 (!) 129/96  07/10/16 120/80     Review of Systems Denies fever chills. Occasional cough. Denies chest pain no headaches.  No difficulty breathing. Occasional palpitations, last a few seconds, not associated with nausea, sweats or presyncope feeling.  Past Medical History:  Diagnosis Date  . History of gestational diabetes   . Hypertension    with pregnancy only  . Migraine    migraines; Exedrine Migraine works    Past Surgical History:  Procedure Laterality Date  . NO PAST SURGERIES      Social History   Socioeconomic History  . Marital status: Married    Spouse name: Not on file  . Number of children: 1  . Years of education: Not on file  . Highest education level: Not on file  Social Needs  . Financial resource strain: Not on file  . Food insecurity - worry: Not on file  . Food insecurity - inability: Not on file  . Transportation needs - medical: Not on file  . Transportation needs - non-medical: Not on file  Occupational History  . Occupation: [employer]  Tobacco Use  . Smoking status: Former Smoker    Last attempt to quit: 08/24/2013    Years since quitting: 3.3  . Smokeless tobacco: Never Used  Substance and Sexual Activity  . Alcohol use: No    Alcohol/week: 0.0 oz  . Drug use: No  . Sexual activity: Yes    Birth control/protection: Injection  Other Topics Concern  . Not on file  Social History Narrative   G1P1   First baby born July 2017      Allergies as of 01/15/2017   No Known Allergies     Medication List        Accurate as of 01/15/17  5:37 PM. Always use your most recent med list.          amLODipine 2.5 MG tablet Commonly known as:  NORVASC Take 1 tablet (2.5 mg total) by mouth daily.   azelastine 0.1 % nasal spray Commonly known as:  ASTELIN Place 2 sprays into both nostrils at bedtime as needed for rhinitis. Use in each nostril as directed   multivitamin tablet Take 1 tablet by mouth daily.          Objective:   Physical Exam BP (!) 139/96 (BP Location: Left Arm, Patient Position: Sitting, Cuff Size: Small)   Pulse 96   Temp 98.1 F (36.7 C) (Oral)   Resp 14   Ht 5\' 6"  (1.676 m)   Wt 216 lb (98 kg)   SpO2 100%   BMI 34.86 kg/m  General:   Well developed, well nourished . NAD.  HEENT:  Normocephalic . Face symmetric, atraumatic. TMs: Both bulge but not red.  Nose congested.  Throat symmetric, no red. Lungs:  CTA B Normal respiratory effort, no intercostal retractions, no accessory muscle use. Heart: RRR,  no murmur.  No pretibial  edema bilaterally  Skin: Not pale. Not jaundice Neurologic:  alert & oriented X3.  Speech normal, gait appropriate for age and unassisted Psych--  Cognition and judgment appear intact.  Cooperative with normal attention span and concentration.  Behavior appropriate. No anxious or depressed appearing.      Assessment & Plan:     Assessment HTN Migraines H/o  Gestational diabetes  Plan: HTN: Patient with previously elevated BP now with consistent diastolic BPs in the 90s.  Has hypertension.  We will get a BMP, CBC, go back on amlodipine 2.5 mg daily which he used to take.  Also low-salt diet. URI: Conservative treatment with Mucinex, Flonase, Astelin.  Avoid decongestants.  See instructions. Contraception: depo-provera  today Hematuria: See last visit, urine test came back negative. RTC, 3 months CPX .

## 2017-01-15 NOTE — Assessment & Plan Note (Signed)
HTN: Patient with previously elevated BP now with consistent diastolic BPs in the 90s.  Has hypertension.  We will get a BMP, CBC, go back on amlodipine 2.5 mg daily which he used to take.  Also low-salt diet. URI: Conservative treatment with Mucinex, Flonase, Astelin.  Avoid decongestants.  See instructions. Contraception: depo-provera  today Hematuria: See last visit, urine test came back negative. RTC, 3 months CPX

## 2017-01-16 DIAGNOSIS — J40 Bronchitis, not specified as acute or chronic: Secondary | ICD-10-CM | POA: Diagnosis not present

## 2017-01-17 ENCOUNTER — Ambulatory Visit: Payer: BLUE CROSS/BLUE SHIELD

## 2017-02-24 ENCOUNTER — Ambulatory Visit: Payer: BLUE CROSS/BLUE SHIELD | Admitting: Family Medicine

## 2017-02-24 ENCOUNTER — Ambulatory Visit: Payer: Self-pay | Admitting: *Deleted

## 2017-02-24 ENCOUNTER — Encounter: Payer: Self-pay | Admitting: Family Medicine

## 2017-02-24 VITALS — BP 128/84 | HR 96 | Temp 98.7°F | Resp 16 | Ht 66.0 in | Wt 215.0 lb

## 2017-02-24 DIAGNOSIS — L509 Urticaria, unspecified: Secondary | ICD-10-CM

## 2017-02-24 MED ORDER — PREDNISONE 20 MG PO TABS: 40.0000 mg | ORAL_TABLET | Freq: Every day | ORAL | 0 refills | Status: AC

## 2017-02-24 MED ORDER — EPINEPHRINE 0.3 MG/0.3ML IJ SOAJ: 0.3000 mg | Freq: Once | INTRAMUSCULAR | 1 refills | Status: AC

## 2017-02-24 MED ORDER — LEVOCETIRIZINE DIHYDROCHLORIDE 5 MG PO TABS: 5.0000 mg | ORAL_TABLET | Freq: Every evening | ORAL | 1 refills | Status: DC

## 2017-02-24 MED ORDER — FAMOTIDINE 20 MG PO TABS: 20.0000 mg | ORAL_TABLET | Freq: Two times a day (BID) | ORAL | 0 refills | Status: DC

## 2017-02-24 NOTE — Patient Instructions (Addendum)
Claritin (loratadine), Allegra (fexofenadine), Zyrtec (cetirizine); these are listed in order from weakest to strongest. Generic, and therefore cheaper, options are in the parentheses.   There are available OTC, and the generic versions, which may be cheaper, are in parentheses. Show this to a pharmacist if you have trouble finding any of these items.  Use the above or Xyzal daily. If this is not helpful in the first couple hours, use the prednisone and Pepcid. The Epipen is there just in case. If you have swelling in your throat or mouth, seek immediate care.  Let me know if things aren't getting better.  Let us know if you need anything.

## 2017-02-24 NOTE — Telephone Encounter (Signed)
Patient broke out with itchy, red rash from face down to her calves. The rash began two and a half days ago, Friday night.  Reports hives on her stomach and waist. She did try benadryl yesterday that helped but couldn't take today in order to go to work and not be sleepy.  We reviewed common products that might cause a rash and came up empty. She is going to try hydrocortisone cream now for the itching. She denies any SOB, chest pain and tongue swelling. No Known Allergies.  Appointment for this afternoon. Reason for Disposition . [1] MODERATE-SEVERE hives persist (i.e., hives interfere with normal activities or work) AND [2] taking antihistamine (e.g., Benadryl, Claritin) > 24 hours  Answer Assessment - Initial Assessment Questions 1. APPEARANCE: "What does the rash look like?"   some of them are raised like hives, arms are red and itchy like fire 2. LOCATION: "Where is the rash located?"   head down to calf area 3. NUMBER: "How many hives are there?"      More than 20 on trunck and waste 4. SIZE: "How big are the hives?" (inches, cm, compare to coins) "Do they all look the same or is there lots of variation in shape and size?"      Dime-sized 5. ONSET: "When did the hives begin?" (Hours or days ago)     Friday night. 6. ITCHING: "Does it itch?" If so, ask: "How bad is the itch?"    - MILD: doesn't interfere with normal activities   - MODERATE-SEVERE: interferes with work, school, sleep, or other activities   itchy all over 7. RECURRENT PROBLEM: "Have you had hives before?" If so, ask: "When was the last time?" and "What happened that time?"   no 8. TRIGGERS: "Were you exposed to any new food, plant, cosmetic product or animal just before the hives began?"    New soap, Dove-started using 7 days ago. 9. OTHER SYMPTOMS: "Do you have any other symptoms?" (e.g., fever, tongue swelling, difficulty breathing, abdominal pain)   Only nasal congestion which started a few days prior the hives 10.  PREGNANCY: "Is there any chance you are pregnant?" "When was your last menstrual period?"      no  Protocols used: HIVES-A-AH

## 2017-02-24 NOTE — Progress Notes (Signed)
Chief Complaint  Patient presents with  . Rash    Complains of skin rash, hives and stuffy nose    Samantha Gould is a 32 y.o. female here for an allergic reaction.  Duration: 3 days Any new medications, lotions, soaps, topicals or detergents? No ACEi/ARB/Estrogen? No Hx of allergic rxn/angioedema/anaphylaxis? No she specifically denies shortness of breath, tongue or lip swelling, or swelling in the throat.  ROS Allergic: As noted in HPI Pulmonary: No SOB MSK: No masses  Past Medical History:  Diagnosis Date  . History of gestational diabetes   . Hypertension    with pregnancy only  . Migraine    migraines; Exedrine Migraine works    Family History  Problem Relation Age of Onset  . Breast cancer Maternal Grandmother   . Diabetes Maternal Grandmother   . Sleep apnea Father   . Sleep apnea Maternal Grandfather   . Diabetes Mother   . Migraines Sister     BP 128/84 (BP Location: Right Arm, Patient Position: Sitting, Cuff Size: Large)   Pulse 96   Temp 98.7 F (37.1 C) (Oral)   Resp 16   Ht 5\' 6"  (1.676 m)   Wt 215 lb (97.5 kg)   SpO2 98%   BMI 34.70 kg/m  General: Well appearing, appearing stated age, well-nourished, awake HEENT: Ears are patent, TM's negative, Nose patent without discharge, MMM, tongue without deviation or edema, uvula without edema, pharynx without erythema or petechiae; Neck without masses, edema or asymmetry Heart: RRR Lungs: CTAB, no rales or stridor, normal respiratory effort without accessory muscle use Neuro: Alert and oriented, fluent and goal-oriented speech Skin: Exposed skin is warm and dry; erythematous wheals with evidence of scratching present on lower back, UE's b/l, no fluctuance, drainage, bleeding Psych: Age appropriate judgment and insight, normal affect and mood  Urticaria - Plan: levocetirizine (XYZAL) 5 MG tablet, famotidine (PEPCID) 20 MG tablet, predniSONE (DELTASONE) 20 MG tablet, EPINEPHrine 0.3 mg/0.3 mL IJ SOAJ  injection  Orders as above. Offered allergy testing, allergy specialist referral, daily antihistamine. Epipen just in case. I think we can do a daily antihistamine. If no improvement, will use Pepcid with the former and pred. Try to avoid irritants.  If no improvement or rebound, she will let us know and we will refer to Allergy team.  Pt informed to seek emergent care if starting to experience SOB, swelling with tongue or airway/neck.  F/u prn at this point. The patient voiced understanding and agreement to the plan.  Jilda Rocheicholas Paul BenningtonWendling, DO 02/24/17 3:04 PM

## 2017-03-01 ENCOUNTER — Encounter: Payer: Self-pay | Admitting: Family Medicine

## 2017-03-02 ENCOUNTER — Encounter: Payer: Self-pay | Admitting: Family Medicine

## 2017-03-02 DIAGNOSIS — L508 Other urticaria: Secondary | ICD-10-CM | POA: Diagnosis not present

## 2017-03-03 ENCOUNTER — Telehealth: Payer: Self-pay | Admitting: Family Medicine

## 2017-03-03 NOTE — Telephone Encounter (Signed)
Copied from CRM [phone]. Topic: Quick Communication - See Telephone Encounter >> Mar 03, 2017  2:14 PM Cipriano BunkerLambe, Annette S wrote: CRM for notification. See Telephone encounter for:   Pt. Calling  sent email yesterday and she is off work today due to rash, red swollen and itching.  What should she do next??  Please call patient   03/03/17.

## 2017-03-03 NOTE — Telephone Encounter (Signed)
Please see emails from 03/01/16 and advise.

## 2017-03-31 ENCOUNTER — Encounter: Payer: Self-pay | Admitting: Internal Medicine

## 2017-03-31 ENCOUNTER — Ambulatory Visit (INDEPENDENT_AMBULATORY_CARE_PROVIDER_SITE_OTHER): Payer: BLUE CROSS/BLUE SHIELD | Admitting: Internal Medicine

## 2017-03-31 ENCOUNTER — Other Ambulatory Visit: Payer: Self-pay

## 2017-03-31 ENCOUNTER — Encounter: Payer: BLUE CROSS/BLUE SHIELD | Admitting: Internal Medicine

## 2017-03-31 VITALS — BP 132/68 | HR 91 | Temp 97.6°F | Resp 14 | Ht 66.0 in | Wt 215.5 lb

## 2017-03-31 DIAGNOSIS — Z3042 Encounter for surveillance of injectable contraceptive: Secondary | ICD-10-CM

## 2017-03-31 DIAGNOSIS — Z0000 Encounter for general adult medical examination without abnormal findings: Secondary | ICD-10-CM | POA: Diagnosis not present

## 2017-03-31 DIAGNOSIS — R002 Palpitations: Secondary | ICD-10-CM

## 2017-03-31 LAB — POCT URINE PREGNANCY: Preg Test, Ur: NEGATIVE

## 2017-03-31 MED ORDER — SUMATRIPTAN SUCCINATE 50 MG PO TABS: 50.0000 mg | ORAL_TABLET | ORAL | 0 refills | Status: AC | PRN

## 2017-03-31 MED ORDER — MEDROXYPROGESTERONE ACETATE 150 MG/ML IM SUSP
150.0000 mg | Freq: Once | INTRAMUSCULAR | Status: AC
  Administered 2017-03-31: 150 mg via INTRAMUSCULAR

## 2017-03-31 NOTE — Progress Notes (Signed)
Pre visit review using our clinic review tool, if applicable. No additional management support is needed unless otherwise documented below in the visit note. 

## 2017-03-31 NOTE — Patient Instructions (Signed)
GO TO THE LAB : Get the blood work     GO TO THE FRONT DESK Schedule your next appointment for a checkup in 6 months  Try Imitrex for the next blurred vision.  May repeat second tablet in 2 hours, no more than 2 tablets in any 24-hour.  Counseling St John's wort?

## 2017-03-31 NOTE — Assessment & Plan Note (Signed)
-  Td 2016 - Female care: To see gynecology soon -Diet and exercise discussed -EKG: NSR, no acute changes -Labs: FLP, A1c, TSH

## 2017-03-31 NOTE — Progress Notes (Signed)
Subjective:    Patient ID: Samantha Gould, female    DOB: 12/10/1985, 32 y.o.   MRN: [id number]  DOS:  03/31/2017 Type of visit - description : CPX Interval history: General feeling well, she does have few symptoms   Review of Systems She is having left eye blurred vision on and off for about 6 weeks, episodes come every 10 days on average, the last 10 minutes, no associated nausea, dizziness, diplopia or headache.  Saw the eye doctor few weeks ago, had a thorough exam. Also, reports palpitations since her baby was born about 20 months ago.  3 or 4 episodes a month, they might last 45 minutes, no associated chest pain, near fainting, headache, nausea, vomiting.  Usually happen in the context of feeling anxious. Was seen with urticaria, still an issue.  Other than above, a 14 point review of systems is negative      Past Medical History:  Diagnosis Date  . History of gestational diabetes   . Hypertension    with pregnancy only  . Migraine    migraines; Exedrine Migraine works    Past Surgical History:  Procedure Laterality Date  . NO PAST SURGERIES      Social History   Socioeconomic History  . Marital status: Married    Spouse name: Not on file  . Number of children: 1  . Years of education: Not on file  . Highest education level: Not on file  Social Needs  . Financial resource strain: Not on file  . Food insecurity - worry: Not on file  . Food insecurity - inability: Not on file  . Transportation needs - medical: Not on file  . Transportation needs - non-medical: Not on file  Occupational History  . Occupation: [employer]  Tobacco Use  . Smoking status: Former Smoker    Last attempt to quit: 08/24/2013    Years since quitting: 3.6  . Smokeless tobacco: Never Used  Substance and Sexual Activity  . Alcohol use: No    Alcohol/week: 0.0 oz  . Drug use: No  . Sexual activity: Yes    Birth control/protection: Injection  Other Topics Concern  . Not on file  Social  History Narrative   G1P1   First baby born July 2017     Family History  Problem Relation Age of Onset  . Breast cancer Maternal Grandmother   . Diabetes Maternal Grandmother   . Sleep apnea Father   . Sleep apnea Maternal Grandfather   . CAD Maternal Grandfather   . Diabetes Mother   . Migraines Sister   . Stroke Neg Hx   . Colon cancer Neg Hx      Allergies as of 03/31/2017   No Known Allergies     Medication List        Accurate as of 03/31/17 11:59 PM. Always use your most recent med list.          amLODipine 2.5 MG tablet Commonly known as:  NORVASC Take 1 tablet (2.5 mg total) by mouth daily.   azelastine 0.1 % nasal spray Commonly known as:  ASTELIN Place 2 sprays into both nostrils at bedtime as needed for rhinitis. Use in each nostril as directed   EPINEPHrine 0.3 mg/0.3 mL Soaj injection Commonly known as:  EPI-PEN Inject 0.3 mg into the muscle once as needed.   levocetirizine 5 MG tablet Commonly known as:  XYZAL Take 1 tablet (5 mg total) by mouth every evening.   multivitamin  tablet Take 1 tablet by mouth daily.   SUMAtriptan 50 MG tablet Commonly known as:  IMITREX Take 1 tablet (50 mg total) by mouth every 2 (two) hours as needed for migraine. No more than 2 tabs in a 24 hour period          Objective:   Physical Exam BP 132/68 (BP Location: Left Arm, Patient Position: Sitting, Cuff Size: Normal)   Pulse 91   Temp 97.6 F (36.4 C) (Oral)   Resp 14   Ht 5\' 6"  (1.676 m)   Wt 215 lb 8 oz (97.8 kg)   SpO2 97%   Breastfeeding? No   BMI 34.78 kg/m  General:   Well developed, well nourished . NAD.  Neck: No  thyromegaly  HEENT:  Normocephalic . Face symmetric, atraumatic Neck: No thyromegaly Lungs:  CTA B Normal respiratory effort, no intercostal retractions, no accessory muscle use. Heart: RRR,  no murmur.  No pretibial edema bilaterally  Abdomen:  Not distended, soft, non-tender. No rebound or rigidity.   Skin: Exposed areas  without rash. Not pale. Not jaundice Neurologic:  alert & oriented X3.  Speech normal, gait appropriate for age and unassisted Strength symmetric and appropriate for age.  Psych: Cognition and judgment appear intact.  Cooperative with normal attention span and concentration.  Behavior appropriate. No anxious or depressed appearing.     Assessment & Plan:     Assessment HTN Migraines CT head (-) 2008, MRI (-) 2012, h/o response to imitrex  H/o  Gestational diabetes  Plan: HTN: Seems well controlled on amlodipine Migraines, h/o: no headaches but have left eye blurred vision, saw her eye doctor, ocular migraines?.  I rec a trial with Imitrex. Contraception: UPT negative, Depo shot today Palpitations: Going on for 20 months, associated with anxiety, EKG NSR, where checking labs, recommend to start by treating anxiety with a counselor , OTC St John's wort?.  Call if not better. Urticaria: Was seen by Dr. Carmelia RollerWendling, sxs okay as long as she takes antihistaminics.  Has a EpiPen with her.  She has already an appointment to see the allergist 04/08/2017. Depo in 3 months  RTC OV in 6 months

## 2017-04-01 LAB — TSH: TSH: 0.7 u[IU]/mL (ref 0.35–4.50)

## 2017-04-01 LAB — LIPID PANEL
CHOL/HDL RATIO: 5
Cholesterol: 218 mg/dL — ABNORMAL HIGH (ref 0–200)
HDL: 42.7 mg/dL (ref >39.00)
LDL CALC: 142 mg/dL — AB (ref 0–99)
NONHDL: 175.11
TRIGLYCERIDES: 166 mg/dL — AB (ref 0.0–149.0)
VLDL: 33.2 mg/dL (ref 0.0–40.0)

## 2017-04-01 LAB — HEMOGLOBIN A1C: Hgb A1c MFr Bld: 5.7 % (ref 4.6–6.5)

## 2017-04-01 NOTE — Assessment & Plan Note (Signed)
HTN: Seems well controlled on amlodipine Migraines, h/o: no headaches but have left eye blurred vision, saw her eye doctor, ocular migraines?.  I rec a trial with Imitrex. Contraception: UPT negative, Depo shot today Palpitations: Going on for 20 months, associated with anxiety, EKG NSR, where checking labs, recommend to start by treating anxiety with a counselor , OTC St John's wort?.  Call if not better. Urticaria: Was seen by Dr. Carmelia RollerWendling, sxs okay as long as she takes antihistaminics.  Has a EpiPen with her.  She has already an appointment to see the allergist 04/08/2017. Depo in 3 months  RTC OV in 6 months

## 2017-04-08 DIAGNOSIS — J3089 Other allergic rhinitis: Secondary | ICD-10-CM | POA: Diagnosis not present

## 2017-04-08 DIAGNOSIS — H1045 Other chronic allergic conjunctivitis: Secondary | ICD-10-CM | POA: Diagnosis not present

## 2017-04-08 DIAGNOSIS — R21 Rash and other nonspecific skin eruption: Secondary | ICD-10-CM | POA: Diagnosis not present

## 2017-04-30 ENCOUNTER — Other Ambulatory Visit: Payer: Self-pay | Admitting: Family Medicine

## 2017-04-30 DIAGNOSIS — L509 Urticaria, unspecified: Secondary | ICD-10-CM

## 2017-05-29 DIAGNOSIS — Z6833 Body mass index (BMI) 33.0-33.9, adult: Secondary | ICD-10-CM | POA: Diagnosis not present

## 2017-05-29 DIAGNOSIS — Z1151 Encounter for screening for human papillomavirus (HPV): Secondary | ICD-10-CM | POA: Diagnosis not present

## 2017-05-29 DIAGNOSIS — Z01419 Encounter for gynecological examination (general) (routine) without abnormal findings: Secondary | ICD-10-CM | POA: Diagnosis not present

## 2017-06-25 ENCOUNTER — Ambulatory Visit: Payer: BLUE CROSS/BLUE SHIELD

## 2017-06-26 ENCOUNTER — Ambulatory Visit (INDEPENDENT_AMBULATORY_CARE_PROVIDER_SITE_OTHER): Payer: BLUE CROSS/BLUE SHIELD

## 2017-06-26 DIAGNOSIS — Z3042 Encounter for surveillance of injectable contraceptive: Secondary | ICD-10-CM

## 2017-06-26 MED ORDER — MEDROXYPROGESTERONE ACETATE 150 MG/ML IM SUSP
150.0000 mg | Freq: Once | INTRAMUSCULAR | Status: AC
  Administered 2017-06-26: 150 mg via INTRAMUSCULAR

## 2017-06-26 NOTE — Progress Notes (Signed)
Pre visit review using our clinic tool,if applicable. No additional management support is needed unless otherwise documented below in the visit note.   Patient in for Depo-Provera injection per order from Dr. Willow OraJose Paz.  Given 150 mg IM leftg deltoid per patient request. Patient tolerated well.  Return appointment scheduled for August 2019.

## 2017-07-21 DIAGNOSIS — N3090 Cystitis, unspecified without hematuria: Secondary | ICD-10-CM | POA: Diagnosis not present

## 2017-07-21 DIAGNOSIS — R300 Dysuria: Secondary | ICD-10-CM | POA: Diagnosis not present

## 2017-07-29 ENCOUNTER — Other Ambulatory Visit: Payer: Self-pay | Admitting: Internal Medicine

## 2017-09-12 ENCOUNTER — Ambulatory Visit (INDEPENDENT_AMBULATORY_CARE_PROVIDER_SITE_OTHER): Payer: BLUE CROSS/BLUE SHIELD

## 2017-09-12 DIAGNOSIS — Z3042 Encounter for surveillance of injectable contraceptive: Secondary | ICD-10-CM

## 2017-09-12 MED ORDER — MEDROXYPROGESTERONE ACETATE 150 MG/ML IM SUSP
150.0000 mg | Freq: Once | INTRAMUSCULAR | Status: AC
  Administered 2017-09-12: 150 mg via INTRAMUSCULAR

## 2017-09-12 NOTE — Progress Notes (Addendum)
Pre visit review using our clinic tool,if applicable. No additional management support is needed unless otherwise documented below in the visit note.   Patient in for Depo Provera injection. Given Left  Deltoid IM.  Patient tolerated well. No complaints voiced this visit.  Next injection scheduled for 11/28/17.  Willow OraJose Paz, MD

## 2017-09-13 ENCOUNTER — Emergency Department (HOSPITAL_BASED_OUTPATIENT_CLINIC_OR_DEPARTMENT_OTHER)
Admission: EM | Admit: 2017-09-13 | Discharge: 2017-09-13 | Disposition: A | Discharge status: Home / Self Care | Payer: BLUE CROSS/BLUE SHIELD | Attending: Emergency Medicine | Admitting: Emergency Medicine

## 2017-09-13 ENCOUNTER — Emergency Department (HOSPITAL_BASED_OUTPATIENT_CLINIC_OR_DEPARTMENT_OTHER): Payer: BLUE CROSS/BLUE SHIELD

## 2017-09-13 ENCOUNTER — Encounter (HOSPITAL_BASED_OUTPATIENT_CLINIC_OR_DEPARTMENT_OTHER): Payer: Self-pay | Admitting: Emergency Medicine

## 2017-09-13 ENCOUNTER — Other Ambulatory Visit: Payer: Self-pay

## 2017-09-13 DIAGNOSIS — I10 Essential (primary) hypertension: Secondary | ICD-10-CM | POA: Diagnosis not present

## 2017-09-13 DIAGNOSIS — G43409 Hemiplegic migraine, not intractable, without status migrainosus: Secondary | ICD-10-CM | POA: Diagnosis not present

## 2017-09-13 DIAGNOSIS — R51 Headache: Secondary | ICD-10-CM | POA: Diagnosis not present

## 2017-09-13 DIAGNOSIS — Z79899 Other long term (current) drug therapy: Secondary | ICD-10-CM | POA: Insufficient documentation

## 2017-09-13 DIAGNOSIS — Z87891 Personal history of nicotine dependence: Secondary | ICD-10-CM | POA: Insufficient documentation

## 2017-09-13 LAB — BASIC METABOLIC PANEL
Anion gap: 7 (ref 5–15)
BUN: 9 mg/dL (ref 6–20)
CHLORIDE: 107 mmol/L (ref 98–111)
CO2: 23 mmol/L (ref 22–32)
Calcium: 8.9 mg/dL (ref 8.9–10.3)
Creatinine, Ser: 0.9 mg/dL (ref 0.44–1.00)
GFR calc non Af Amer: >60 mL/min (ref >60)
Glucose, Bld: 102 mg/dL — ABNORMAL HIGH (ref 70–99)
POTASSIUM: 3.8 mmol/L (ref 3.5–5.1)
SODIUM: 137 mmol/L (ref 135–145)

## 2017-09-13 LAB — CBC WITH DIFFERENTIAL/PLATELET
Basophils Absolute: 0 10*3/uL (ref 0.0–0.1)
Basophils Relative: 0 %
EOS ABS: 0.1 10*3/uL (ref 0.0–0.7)
EOS PCT: 1 %
HCT: 42.3 % (ref 36.0–46.0)
HEMOGLOBIN: 14.7 g/dL (ref 12.0–15.0)
LYMPHS ABS: 2.7 10*3/uL (ref 0.7–4.0)
Lymphocytes Relative: 36 %
MCH: 30.5 pg (ref 26.0–34.0)
MCHC: 34.8 g/dL (ref 30.0–36.0)
MCV: 87.8 fL (ref 78.0–100.0)
MONO ABS: 0.5 10*3/uL (ref 0.1–1.0)
MONOS PCT: 7 %
Neutro Abs: 4 10*3/uL (ref 1.7–7.7)
Neutrophils Relative %: 56 %
PLATELETS: 278 10*3/uL (ref 150–400)
RBC: 4.82 MIL/uL (ref 3.87–5.11)
RDW: 12.7 % (ref 11.5–15.5)
WBC: 7.3 10*3/uL (ref 4.0–10.5)

## 2017-09-13 LAB — RAPID URINE DRUG SCREEN, HOSP PERFORMED
AMPHETAMINES: NOT DETECTED
Barbiturates: NOT DETECTED
Benzodiazepines: NOT DETECTED
COCAINE: NOT DETECTED
OPIATES: NOT DETECTED
TETRAHYDROCANNABINOL: NOT DETECTED

## 2017-09-13 LAB — URINALYSIS, ROUTINE W REFLEX MICROSCOPIC
BILIRUBIN URINE: NEGATIVE
GLUCOSE, UA: NEGATIVE mg/dL
KETONES UR: NEGATIVE mg/dL
Nitrite: NEGATIVE
PH: 5.5 (ref 5.0–8.0)
Protein, ur: NEGATIVE mg/dL
Specific Gravity, Urine: >1.03 — ABNORMAL HIGH (ref 1.005–1.030)

## 2017-09-13 LAB — URINALYSIS, MICROSCOPIC (REFLEX)

## 2017-09-13 LAB — CBG MONITORING, ED: Glucose-Capillary: 89 mg/dL (ref 70–99)

## 2017-09-13 LAB — PREGNANCY, URINE: Preg Test, Ur: NEGATIVE

## 2017-09-13 MED ORDER — SODIUM CHLORIDE 0.9 % IV BOLUS
1000.0000 mL | Freq: Once | INTRAVENOUS | Status: AC
  Administered 2017-09-13: 1000 mL via INTRAVENOUS

## 2017-09-13 MED ORDER — METOCLOPRAMIDE HCL 5 MG/ML IJ SOLN
10.0000 mg | Freq: Once | INTRAMUSCULAR | Status: AC
  Administered 2017-09-13: 10 mg via INTRAVENOUS
  Filled 2017-09-13: qty 2

## 2017-09-13 MED ORDER — DIPHENHYDRAMINE HCL 50 MG/ML IJ SOLN
25.0000 mg | Freq: Once | INTRAMUSCULAR | Status: AC
  Administered 2017-09-13: 25 mg via INTRAVENOUS
  Filled 2017-09-13: qty 1

## 2017-09-13 NOTE — Discharge Instructions (Addendum)
If your headache recurs or worsens, you develop vomiting, fever, blurry vision, weakness or numbness in your arms or legs, or any other new/concerning symptoms and return to the ER for evaluation.

## 2017-09-13 NOTE — ED Notes (Signed)
Patient is resting comfortably, states," I feel so much better" Family at bedside

## 2017-09-13 NOTE — ED Notes (Signed)
ED Provider at bedside. 

## 2017-09-13 NOTE — ED Provider Notes (Signed)
[facility] EMERGENCY DEPARTMENT Provider Note   CSN: [id number] Arrival date & time: 09/13/17  [id number]     History   Chief Complaint Chief Complaint  Patient presents with  . Numbness    HPI Samantha PulseKrystal C Gould is a 32 y.o. female.  HPI  32 year old female presents with headache, right vision loss and right sided numbness.  She woke up feeling normal.  She was at the store when all of a sudden she developed a left-sided headache.  Feels like a pounding sensation.  She is had this before with prior migraines.  She is to have many more migraines but with the help of her primary care physician her headaches have been less frequent.  She then started to develop right lateral vision loss out of the right eye only.  She has had this once before and told it was related to migraines.  Then she noticed numbness and tingling starting first in her right thumb and then sequentially progressing to each finger in the right hand and then slowly tracking up her arm into her face.  After this is started to track down the right side of her torso.  Started to develop some numbness and tingling in the right foot that started to a send.  All of these numbness, tingling, and vision sensations have all resolved.  The headache remains and is about a 9 out of 10.  Past Medical History:  Diagnosis Date  . History of gestational diabetes   . Hypertension    with pregnancy only  . Migraine    migraines; Exedrine Migraine works    Patient Active Problem List   Diagnosis Date Noted  . Hx of gestational diabetes mellitus, not currently pregnant 01/15/2017  . Postpartum care following vaginal delivery (7/26) 08/16/2015  . Preterm premature rupture of membranes 08/15/2015  . Indication for care in labor or delivery 08/15/2015  . PCP NOTES >>> 10/26/2014  . Annual physical exam 02/11/2011  . Migraine without aura 11/07/2006    Past Surgical History:  Procedure Laterality Date  . NO PAST SURGERIES         OB History    Gravida  1   Para  1   Term  0   Preterm  1   AB  0   Living  1     SAB  0   TAB  0   Ectopic  0   Multiple  0   Live Births  1            Home Medications    Prior to Admission medications   Medication Sig Start Date End Date Taking? Authorizing Provider  amLODipine (NORVASC) 2.5 MG tablet Take 1 tablet (2.5 mg total) by mouth daily. 07/29/17  Yes Paz, Nolon RodJose E, MD  azelastine (ASTELIN) 0.1 % nasal spray Place 2 sprays into both nostrils at bedtime as needed for rhinitis. Use in each nostril as directed Patient not taking: Reported on 03/31/2017 01/15/17   Wanda PlumpPaz, Jose E, MD  EPINEPHrine 0.3 mg/0.3 mL IJ SOAJ injection Inject 0.3 mg into the muscle once as needed. 02/24/17   [provider]  levocetirizine (XYZAL) 5 MG tablet Take 1 tablet (5 mg total) by mouth every evening. 04/30/17   Wanda PlumpPaz, Jose E, MD  Multiple Vitamin (MULTIVITAMIN) tablet Take 1 tablet by mouth daily.    [provider]  SUMAtriptan (IMITREX) 50 MG tablet Take 1 tablet (50 mg total) by mouth every 2 (two)  hours as needed for migraine. No more than 2 tabs in a 24 hour period 03/31/17   Wanda Plump, MD    Family History Family History  Problem Relation Age of Onset  . Breast cancer Maternal Grandmother   . Diabetes Maternal Grandmother   . Sleep apnea Father   . Sleep apnea Maternal Grandfather   . CAD Maternal Grandfather   . Diabetes Mother   . Migraines Sister   . Stroke Neg Hx   . Colon cancer Neg Hx     Social History Social History   Tobacco Use  . Smoking status: Former Smoker    Last attempt to quit: 08/24/2013    Years since quitting: 4.0  . Smokeless tobacco: Never Used  Substance Use Topics  . Alcohol use: No    Alcohol/week: 0.0 standard drinks  . Drug use: No     Allergies   Patient has no known allergies.   Review of Systems Review of Systems  Constitutional: Negative for fever.  Eyes: Positive for visual disturbance.   Gastrointestinal: Negative for vomiting.  Musculoskeletal: Negative for neck pain.  Neurological: Positive for numbness and headaches. Negative for weakness.  All other systems reviewed and are negative.    Physical Exam Updated Vital Signs BP (!) 125/92   Pulse 65   Temp 98.2 F (36.8 C) (Oral)   Resp 18   Ht 5\' 5"  (1.651 m)   Wt 94.5 kg   SpO2 97%   BMI 34.67 kg/m   Physical Exam  Constitutional: She is oriented to person, place, and time. She appears well-developed and well-nourished. No distress.  HENT:  Head: Normocephalic and atraumatic.  Right Ear: External ear normal.  Left Ear: External ear normal.  Nose: Nose normal.  Eyes: Pupils are equal, round, and reactive to light. EOM are normal. Right eye exhibits no discharge. Left eye exhibits no discharge.  Normal visual fields  Neck: Normal range of motion. Neck supple.  Cardiovascular: Normal rate, regular rhythm and normal heart sounds.  Pulmonary/Chest: Effort normal and breath sounds normal.  Abdominal: Soft. She exhibits no distension. There is no tenderness.  Neurological: She is alert and oriented to person, place, and time.  CN 3-12 grossly intact. 5/5 strength in all 4 extremities. Grossly normal sensation. Normal finger to nose.   Skin: Skin is warm and dry. She is not diaphoretic.  Nursing note and vitals reviewed.    ED Treatments / Results  Labs (all labs ordered are listed, but only abnormal results are displayed) Labs Reviewed  BASIC METABOLIC PANEL - Abnormal; Notable for the following components:      Result Value   Glucose, Bld 102 (*)    All other components within normal limits  URINALYSIS, ROUTINE W REFLEX MICROSCOPIC - Abnormal; Notable for the following components:   APPearance CLOUDY (*)    Specific Gravity, Urine >1.030 (*)    Hgb urine dipstick TRACE (*)    Leukocytes, UA SMALL (*)    All other components within normal limits  URINALYSIS, MICROSCOPIC (REFLEX) - Abnormal; Notable  for the following components:   Bacteria, UA MANY (*)    All other components within normal limits  RAPID URINE DRUG SCREEN, HOSP PERFORMED  PREGNANCY, URINE  CBC WITH DIFFERENTIAL/PLATELET  CBG MONITORING, ED    EKG EKG Interpretation  Date/Time:  Saturday September 13 2017 10:13:00 EDT Ventricular Rate:  72 PR Interval:    QRS Duration: 88 QT Interval:  390 QTC Calculation: 427 R Axis:  71 Text Interpretation:  Sinus rhythm Atrial premature complex no acute ST/T changes No old tracing to compare Confirmed by Pricilla Loveless [phone]) on 09/13/2017 10:43:11 AM   Radiology Ct Head Wo Contrast  Result Date: 09/13/2017 CLINICAL DATA:  32 year old female with acute headache and RIGHT visual disturbance for 2 hours. EXAM: CT HEAD WITHOUT CONTRAST TECHNIQUE: Contiguous axial images were obtained from the base of the skull through the vertex without intravenous contrast. COMPARISON:  06/29/2010 brain MR and 11/14/2006 head CT FINDINGS: Brain: No evidence of acute infarction, hemorrhage, hydrocephalus, extra-axial collection or mass lesion/mass effect. Vascular: No hyperdense vessel or unexpected calcification. Skull: Normal. Negative for fracture or focal lesion. Sinuses/Orbits: No acute finding. Other: None. IMPRESSION: Unremarkable noncontrast head CT. Electronically Signed   By: Harmon Pier M.D.   On: 09/13/2017 11:12    Procedures Procedures (including critical care time)  Medications Ordered in ED Medications  sodium chloride 0.9 % bolus 1,000 mL (1,000 mLs Intravenous New Bag/Given 09/13/17 1031)  metoCLOPramide (REGLAN) injection 10 mg (10 mg Intravenous Given 09/13/17 1032)  diphenhydrAMINE (BENADRYL) injection 25 mg (25 mg Intravenous Given 09/13/17 1032)     Initial Impression / Assessment and Plan / ED Course  I have reviewed the triage vital signs and the nursing notes.  Pertinent labs & imaging results that were available during my care of the patient were reviewed by me and  considered in my medical decision making (see chart for details).  Clinical Course as of Sep 14 1126  Sat Sep 13, 2017  1011 The patient's numbness and tingling in her right side is gone.  My suspicion for stroke is low and I think this is almost undoubtedly a complicated migraine.  The headache otherwise feels very similar to prior migraines.  The visual deficit is also gone.  Given the acuity of symptoms with the numbness, I will get a CT head, mostly to help rule out an obvious space occupying lesion.  However suspicion for stroke or bleed is low.  Will treat headache with Reglan, Benadryl and fluids.   [SG]    Clinical Course User Index [SG] Pricilla Loveless, MD    Work-up is unremarkable.  Has some bacteria in urine but no symptoms and is contaminated with squamous epithelial cells.  Headache has resolved with treatment.  CT benign.  I highly doubt that she has had a stroke or TIA.  She had more of a marching presentation to her numbness that would not fit anatomically.  I think this is complicated migraine.  No seizure-like activity.  I think is reasonable to have her follow-up with neurology as an outpatient but I do not think an emergent work-up with MRI is needed.  Discussed return precautions.  Final Clinical Impressions(s) / ED Diagnoses   Final diagnoses:  Hemiplegic migraine without status migrainosus, not intractable    ED Discharge Orders         Ordered    Ambulatory referral to Neurology    Comments:  An appointment is requested in approximately: 2 weeks   09/13/17 1127           Pricilla Loveless, MD 09/13/17 1129

## 2017-09-13 NOTE — ED Notes (Signed)
Up to BR for u/a, gait steady  

## 2017-09-13 NOTE — ED Notes (Signed)
Patient transported to CT 

## 2017-09-13 NOTE — ED Triage Notes (Signed)
Pt reports sudden onset of headache to the L side at 9:30 this morning with R arm numbness and tingling. Pt with equal grips, no drift noted, speech is clear, no facial droop noted. Pt states she is also having some tingling in her R leg.

## 2017-09-19 DIAGNOSIS — H6590 Unspecified nonsuppurative otitis media, unspecified ear: Secondary | ICD-10-CM | POA: Diagnosis not present

## 2017-09-26 ENCOUNTER — Encounter (HOSPITAL_BASED_OUTPATIENT_CLINIC_OR_DEPARTMENT_OTHER): Payer: Self-pay | Admitting: *Deleted

## 2017-09-26 ENCOUNTER — Other Ambulatory Visit: Payer: Self-pay

## 2017-09-26 ENCOUNTER — Emergency Department (HOSPITAL_BASED_OUTPATIENT_CLINIC_OR_DEPARTMENT_OTHER)
Admission: EM | Admit: 2017-09-26 | Discharge: 2017-09-26 | Disposition: A | Discharge status: Home / Self Care | Payer: BLUE CROSS/BLUE SHIELD | Attending: Emergency Medicine | Admitting: Emergency Medicine

## 2017-09-26 ENCOUNTER — Emergency Department (HOSPITAL_BASED_OUTPATIENT_CLINIC_OR_DEPARTMENT_OTHER): Payer: BLUE CROSS/BLUE SHIELD

## 2017-09-26 DIAGNOSIS — R51 Headache: Secondary | ICD-10-CM | POA: Diagnosis not present

## 2017-09-26 DIAGNOSIS — Z87891 Personal history of nicotine dependence: Secondary | ICD-10-CM | POA: Insufficient documentation

## 2017-09-26 DIAGNOSIS — G43809 Other migraine, not intractable, without status migrainosus: Secondary | ICD-10-CM | POA: Insufficient documentation

## 2017-09-26 DIAGNOSIS — Z79899 Other long term (current) drug therapy: Secondary | ICD-10-CM | POA: Insufficient documentation

## 2017-09-26 LAB — COMPREHENSIVE METABOLIC PANEL
ALBUMIN: 4.4 g/dL (ref 3.5–5.0)
ALT: 25 U/L (ref 0–44)
AST: 27 U/L (ref 15–41)
Alkaline Phosphatase: 77 U/L (ref 38–126)
Anion gap: 12 (ref 5–15)
BILIRUBIN TOTAL: 1.3 mg/dL — AB (ref 0.3–1.2)
BUN: 12 mg/dL (ref 6–20)
CALCIUM: 9 mg/dL (ref 8.9–10.3)
CO2: 22 mmol/L (ref 22–32)
Chloride: 108 mmol/L (ref 98–111)
Creatinine, Ser: 0.72 mg/dL (ref 0.44–1.00)
GFR calc Af Amer: >60 mL/min (ref >60)
GFR calc non Af Amer: >60 mL/min (ref >60)
GLUCOSE: 163 mg/dL — AB (ref 70–99)
Potassium: 3.6 mmol/L (ref 3.5–5.1)
SODIUM: 142 mmol/L (ref 135–145)
TOTAL PROTEIN: 7.9 g/dL (ref 6.5–8.1)

## 2017-09-26 LAB — CBC WITH DIFFERENTIAL/PLATELET
BASOS PCT: 0 %
Basophils Absolute: 0 10*3/uL (ref 0.0–0.1)
Eosinophils Absolute: 0.1 10*3/uL (ref 0.0–0.7)
Eosinophils Relative: 1 %
HEMATOCRIT: 43 % (ref 36.0–46.0)
Hemoglobin: 15.2 g/dL — ABNORMAL HIGH (ref 12.0–15.0)
Lymphocytes Relative: 23 %
Lymphs Abs: 2.3 10*3/uL (ref 0.7–4.0)
MCH: 30.9 pg (ref 26.0–34.0)
MCHC: 35.3 g/dL (ref 30.0–36.0)
MCV: 87.4 fL (ref 78.0–100.0)
MONOS PCT: 6 %
Monocytes Absolute: 0.6 10*3/uL (ref 0.1–1.0)
NEUTROS ABS: 7.1 10*3/uL (ref 1.7–7.7)
NEUTROS PCT: 70 %
Platelets: 255 10*3/uL (ref 150–400)
RBC: 4.92 MIL/uL (ref 3.87–5.11)
RDW: 12.4 % (ref 11.5–15.5)
WBC: 10.1 10*3/uL (ref 4.0–10.5)

## 2017-09-26 LAB — PREGNANCY, URINE: PREG TEST UR: NEGATIVE

## 2017-09-26 MED ORDER — ONDANSETRON HCL 4 MG/2ML IJ SOLN
4.0000 mg | Freq: Once | INTRAMUSCULAR | Status: AC
  Administered 2017-09-26: 4 mg via INTRAVENOUS
  Filled 2017-09-26: qty 2

## 2017-09-26 MED ORDER — KETOROLAC TROMETHAMINE 30 MG/ML IJ SOLN
30.0000 mg | Freq: Once | INTRAMUSCULAR | Status: AC
  Administered 2017-09-26: 30 mg via INTRAVENOUS
  Filled 2017-09-26: qty 1

## 2017-09-26 MED ORDER — DEXAMETHASONE SODIUM PHOSPHATE 10 MG/ML IJ SOLN
10.0000 mg | Freq: Once | INTRAMUSCULAR | Status: AC
  Administered 2017-09-26: 10 mg via INTRAVENOUS
  Filled 2017-09-26: qty 1

## 2017-09-26 MED ORDER — METOCLOPRAMIDE HCL 5 MG/ML IJ SOLN
10.0000 mg | Freq: Once | INTRAMUSCULAR | Status: AC
  Administered 2017-09-26: 10 mg via INTRAVENOUS
  Filled 2017-09-26: qty 2

## 2017-09-26 MED ORDER — ONDANSETRON HCL 4 MG/2ML IJ SOLN
INTRAMUSCULAR | Status: AC
  Filled 2017-09-26: qty 2

## 2017-09-26 MED ORDER — DIPHENHYDRAMINE HCL 50 MG/ML IJ SOLN
25.0000 mg | Freq: Once | INTRAMUSCULAR | Status: AC
  Administered 2017-09-26: 25 mg via INTRAVENOUS
  Filled 2017-09-26: qty 1

## 2017-09-26 MED ORDER — SODIUM CHLORIDE 0.9 % IV BOLUS
1000.0000 mL | Freq: Once | INTRAVENOUS | Status: AC
  Administered 2017-09-26: 1000 mL via INTRAVENOUS

## 2017-09-26 NOTE — ED Triage Notes (Signed)
Headache since this 7:30 this morning, took 2 Excedrin with minimal relief.  Numbness starting from her fingers all the way to her arms, face, side of her body and both legs and it stops.

## 2017-09-26 NOTE — ED Notes (Signed)
Patient provided apple juice for PO challenge.

## 2017-09-26 NOTE — ED Notes (Signed)
Pt able to ambulate in hall unassisted, in NAD. States she feels much better.

## 2017-09-26 NOTE — Discharge Instructions (Signed)
You can take Tylenol or Ibuprofen as directed for pain. You can alternate Tylenol and Ibuprofen every 4 hours. If you take Tylenol at 1pm, then you can take Ibuprofen at 5pm. Then you can take Tylenol again at 9pm.   You can also take the migraine medication you were prescribed.   Make sure you are drinking of plenty of fluids and staying hydrated.   Follow up with your neurologist as directed.   Return to the Emergency Department for any worsening pain, vision changes, vomiting, abdominal pain, difficulty walking or any other worsening or  concerning symptoms.

## 2017-09-26 NOTE — ED Provider Notes (Signed)
[facility] EMERGENCY DEPARTMENT Provider Note   CSN: [id number] Arrival date & time: 09/26/17  0946     History   Chief Complaint Chief Complaint  Patient presents with  . Migraine  . Numbness to BUE, BLE    HPI Samantha Gould is a 32 y.o. female with PMH/o HTN, Migraine who presents for evaluation of headache, generalized numbness, and blurry vision that began this morning. Patient reports that she started developing a frontal headache at around 7:30 am this morning. She reports that the headache was gradual in nature and progressively became worse. She describes it as sharp and throbbing headache. She then developed some numbness/tingling to the right side of her body. She states initially the numbness started in her right fingers and then began spreading up her arm and into her face. Several minutes later she started developing some numbness to the RLE and right chest and abdomen. Husband states that during this time patient did not have any speech difficulty or facial drooping. Patient reports that the numbness will resolve and then "jump to other places," including her left side. At this time she reports that the numbness has improved. Additionally, she had some blurry vision while at home. On ED arrival, patient reports that the blurry vision has resolved. She does endorse some nausea and had an episode of vomiting on ED arrival. Patient reports that the symptoms are similar in nature to her previous episodes of migraines but are slightly worse today. She denies any preceding trauma, injury, fall. Patient denies any fevers, CP, abdominal pain.   The history is provided by the patient.    Past Medical History:  Diagnosis Date  . History of gestational diabetes   . Hypertension    with pregnancy only  . Migraine    migraines; Exedrine Migraine works    Patient Active Problem List   Diagnosis Date Noted  . Hx of gestational diabetes mellitus, not currently pregnant  01/15/2017  . Postpartum care following vaginal delivery (7/26) 08/16/2015  . Preterm premature rupture of membranes 08/15/2015  . Indication for care in labor or delivery 08/15/2015  . PCP NOTES >>> 10/26/2014  . Annual physical exam 02/11/2011  . Migraine without aura 11/07/2006    Past Surgical History:  Procedure Laterality Date  . NO PAST SURGERIES       OB History    Gravida  1   Para  1   Term  0   Preterm  1   AB  0   Living  1     SAB  0   TAB  0   Ectopic  0   Multiple  0   Live Births  1            Home Medications    Prior to Admission medications   Medication Sig Start Date End Date Taking? Authorizing Provider  amLODipine (NORVASC) 2.5 MG tablet Take 1 tablet (2.5 mg total) by mouth daily. 07/29/17  Yes Paz, Nolon Rod, MD  azelastine (ASTELIN) 0.1 % nasal spray Place 2 sprays into both nostrils at bedtime as needed for rhinitis. Use in each nostril as directed Patient not taking: Reported on 03/31/2017 01/15/17   Wanda Plump, MD  EPINEPHrine 0.3 mg/0.3 mL IJ SOAJ injection Inject 0.3 mg into the muscle once as needed. 02/24/17   [provider]  levocetirizine (XYZAL) 5 MG tablet Take 1 tablet (5 mg total) by mouth every evening. 04/30/17   Wanda Plump,  MD  Multiple Vitamin (MULTIVITAMIN) tablet Take 1 tablet by mouth daily.    [provider]  SUMAtriptan (IMITREX) 50 MG tablet Take 1 tablet (50 mg total) by mouth every 2 (two) hours as needed for migraine. No more than 2 tabs in a 24 hour period 03/31/17   Wanda Plump, MD    Family History Family History  Problem Relation Age of Onset  . Breast cancer Maternal Grandmother   . Diabetes Maternal Grandmother   . Sleep apnea Father   . Sleep apnea Maternal Grandfather   . CAD Maternal Grandfather   . Diabetes Mother   . Migraines Sister   . Stroke Neg Hx   . Colon cancer Neg Hx     Social History Social History   Tobacco Use  . Smoking status: Former Smoker    Last  attempt to quit: 08/24/2013    Years since quitting: 4.0  . Smokeless tobacco: Never Used  Substance Use Topics  . Alcohol use: No    Alcohol/week: 0.0 standard drinks  . Drug use: No     Allergies   Patient has no known allergies.   Review of Systems Review of Systems  Constitutional: Negative for fever.  Eyes: Positive for visual disturbance.  Respiratory: Negative for cough and shortness of breath.   Cardiovascular: Negative for chest pain.  Gastrointestinal: Positive for nausea and vomiting. Negative for abdominal pain.  Genitourinary: Negative for dysuria and hematuria.  Neurological: Positive for numbness and headaches.  All other systems reviewed and are negative.    Physical Exam Updated Vital Signs BP (!) 135/99 (BP Location: Left Arm)   Pulse 100   Temp 98.7 F (37.1 C) (Oral)   Resp 16   Ht 5' (1.524 m)   Wt 94.3 kg   SpO2 99%   BMI 40.62 kg/m   Physical Exam  Constitutional: She is oriented to person, place, and time. She appears well-developed and well-nourished.  Appears uncomfortable but no acute distress   HENT:  Head: Normocephalic and atraumatic.  Mouth/Throat: Oropharynx is clear and moist and mucous membranes are normal.  Eyes: Pupils are equal, round, and reactive to light. Conjunctivae, EOM and lids are normal.  Neck: Full passive range of motion without pain. Neck supple. No neck rigidity.  Neck is supple and without rigidity.   Cardiovascular: Normal rate, regular rhythm, normal heart sounds and normal pulses. Exam reveals no gallop and no friction rub.  No murmur heard. Pulmonary/Chest: Effort normal and breath sounds normal.  Abdominal: Soft. Normal appearance. There is no tenderness. There is no rigidity and no guarding.  Abdomen is soft, non-distended, non-tender. No rigidity, No guarding. No peritoneal signs.  Musculoskeletal: Normal range of motion.  Neurological: She is alert and oriented to person, place, and time.  Cranial nerves  III-XII intact Follows commands, Moves all extremities  5/5 strength to BUE and BLE  Sensation intact throughout all major nerve distributions (Patient reports numbness has resolved) No gait abnormalities  No slurred speech. No facial droop.   Skin: Skin is warm and dry. Capillary refill takes less than 2 seconds.  Psychiatric: She has a normal mood and affect. Her speech is normal.  Nursing note and vitals reviewed.    ED Treatments / Results  Labs (all labs ordered are listed, but only abnormal results are displayed) Labs Reviewed  CBC WITH DIFFERENTIAL/PLATELET - Abnormal; Notable for the following components:      Result Value   Hemoglobin 15.2 (*)  All other components within normal limits  COMPREHENSIVE METABOLIC PANEL - Abnormal; Notable for the following components:   Glucose, Bld 163 (*)    Total Bilirubin 1.3 (*)    All other components within normal limits  PREGNANCY, URINE    EKG None  Radiology Ct Head Wo Contrast  Result Date: 09/26/2017 CLINICAL DATA:  Headache with upper extremity numbness EXAM: CT HEAD WITHOUT CONTRAST TECHNIQUE: Contiguous axial images were obtained from the base of the skull through the vertex without intravenous contrast. COMPARISON:  September 13, 2017 FINDINGS: Brain: Ventricles are normal in size and configuration. There is no evident intracranial mass, hemorrhage, extra-axial fluid collection, or midline shift. Gray-white compartments appear normal. No evident acute infarct. Vascular: No hyperdense vessels.  No vascular calcification evident. Skull: Bony calvarium appears intact. Sinuses/Orbits: There are retention cysts in each maxillary antrum. Paranasal sinuses elsewhere clear. Orbits appear symmetric bilaterally. Other: Mastoid air cells are clear. IMPRESSION: Retention cysts in each maxillary antrum. Study otherwise unremarkable. Electronically Signed   By: Bretta Bang III M.D.   On: 09/26/2017 11:27    Procedures Procedures  (including critical care time)  Medications Ordered in ED Medications  sodium chloride 0.9 % bolus 1,000 mL (0 mLs Intravenous Stopped 09/26/17 1205)  ondansetron (ZOFRAN) injection 4 mg (4 mg Intravenous Given 09/26/17 1045)  diphenhydrAMINE (BENADRYL) injection 25 mg (25 mg Intravenous Given 09/26/17 1046)  metoCLOPramide (REGLAN) injection 10 mg (10 mg Intravenous Given 09/26/17 1049)  ketorolac (TORADOL) 30 MG/ML injection 30 mg (30 mg Intravenous Given 09/26/17 1048)  dexamethasone (DECADRON) injection 10 mg (10 mg Intravenous Given 09/26/17 1313)     Initial Impression / Assessment and Plan / ED Course  I have reviewed the triage vital signs and the nursing notes.  Pertinent labs & imaging results that were available during my care of the patient were reviewed by me and considered in my medical decision making (see chart for details).     32 y.o. F with PMH/o migraines who presents for evaluation of headache, numbness and blurry vision that began this morning.  Patient has a history of complex migraines and states that symptoms are similar to previous episodes of migraines.  She does state that she feels like this was slightly worse and she is having vomiting with this 1 which she states she has not had before.  Denies any preceding trauma, injury.  On ED arrival, the numbness and vision changes have resolved but she still having headache, nausea/vomiting. Patient is afebrile, non-toxic appearing, sitting comfortably on examination table. Vital signs reviewed and stable.  On exam, normal neuro exam.  She states all sensory deficits have resolved completely.  Consider complex migraine.  Given that she states that this is worse and she is having vomiting, will obtain CT head for evaluation of intracranial hemorrhage, though low suspicion given exam.  At this time, do not feel that this is a CVA or TIA.  Patient states that her symptoms did not occur all at once.  She states that the tingling initially  started on the fingers and spread upward in a progressive distribution.  Additionally, she states that the numbness would not jump back and forth between the right side and the left side.  This does not sound typical of a presentation for CVA or TIA. History/physical exam is not concerning for meningitis.  We will plan to check basic labs.    CBC with no evidence of leukocytosis, anemia.  CMP is unremarkable.  Urine pregnancy is  negative.  CT head is unremarkable.  Reevaluation.  Patient is sleeping comfortably in bed.  Re-evaluation. Patient is awake in no distress. She reports improved headache after analgesics.  She states the headache is 2/10.  We will plan to p.o. challenge and ambulate.  Patient patient able to tolerate p.o. without any difficulty.  She has been ambulating in the hallway without any difficulty.  Patient reports improvement in symptoms.  Patient stable for discharge at this time.  Patient has an appointment scheduled with a neurologist in October for further evaluation.  She does report that she has been prescribed migraine medication.  Encouraged to take it as directed. Patient had ample opportunity for questions and discussion. All patient's questions were answered with full understanding. Strict return precautions discussed. Patient expresses understanding and agreement to plan.   Final Clinical Impressions(s) / ED Diagnoses   Final diagnoses:  Other migraine without status migrainosus, not intractable    ED Discharge Orders    None       Maxwell Caul, PA-C 09/26/17 1949    Alvira Monday, MD 09/27/17 [phone]

## 2017-10-13 ENCOUNTER — Ambulatory Visit: Payer: BLUE CROSS/BLUE SHIELD | Admitting: Internal Medicine

## 2017-10-13 ENCOUNTER — Encounter: Payer: Self-pay | Admitting: Internal Medicine

## 2017-10-13 VITALS — BP 124/66 | HR 78 | Temp 97.4°F | Resp 16 | Ht 66.0 in | Wt 212.2 lb

## 2017-10-13 DIAGNOSIS — G43009 Migraine without aura, not intractable, without status migrainosus: Secondary | ICD-10-CM

## 2017-10-13 DIAGNOSIS — H6980 Other specified disorders of Eustachian tube, unspecified ear: Secondary | ICD-10-CM

## 2017-10-13 MED ORDER — TOPIRAMATE 25 MG PO TABS: 25.0000 mg | ORAL_TABLET | Freq: Two times a day (BID) | ORAL | 1 refills | Status: DC

## 2017-10-13 MED ORDER — AZELASTINE HCL 0.1 % NA SOLN: 2.0000 | Freq: Every evening | NASAL | 6 refills | Status: DC | PRN

## 2017-10-13 NOTE — Patient Instructions (Addendum)
  Start Topamax 25 mg: 1 tablet at bedtime for 1 week Then take 1 tablet twice a day  Okay to take Excedrin or Imitrex as needed  Watch for migraine triggers.  Need to rest at least 7 or 8 hours at night.   For the ear congestion: Take Astelin every night Take Flonase every morning Do it for at least 3 weeks.  If not better might need a round of prednisone

## 2017-10-13 NOTE — Progress Notes (Signed)
Subjective:    Patient ID: Samantha Gould, female    DOB: [birth year], 32 y.o.   MRN: [id number]  DOS:  10/13/2017 Type of visit - description : ed f/u  Went to the ER 09/13/2017, had a headache, labs were okay except for bacteriuria. CT head negative  Went to the ER again on 09/26/2017, at that time, headaches were similar to previous complex migraines, labs okay, UPT negative, CT head showed a retention cyst on each maxillary antrum but otherwise okay.   Review of Systems  At this point, she continue with headaches, every other day, not as intense as on 09/26/2017.  Her headaches are consistent with previous migraines. Since the last ER visit , no further arm paresthesias but she still has some visual disturbances. Headache responded better to Excedrin than Imitrex. Denies excessive caffeine intake, 1 cup daily. Denies fever or chills In addition reports right ear feel muffled for 3 weeks.  Symptoms did not the start after a URI or infection.  Past Medical History:  Diagnosis Date  . History of gestational diabetes   . Hypertension    with pregnancy only  . Migraine    migraines; Exedrine Migraine works    Past Surgical History:  Procedure Laterality Date  . NO PAST SURGERIES      Social History   Socioeconomic History  . Marital status: Married    Spouse name: Not on file  . Number of children: 1  . Years of education: Not on file  . Highest education level: Not on file  Occupational History  . Occupation: [employer]  Social Needs  . Financial resource strain: Not on file  . Food insecurity:    Worry: Not on file    Inability: Not on file  . Transportation needs:    Medical: Not on file    Non-medical: Not on file  Tobacco Use  . Smoking status: Former Smoker    Last attempt to quit: 08/24/2013    Years since quitting: 4.1  . Smokeless tobacco: Never Used  Substance and Sexual Activity  . Alcohol use: No    Alcohol/week: 0.0 standard drinks  . Drug use: No  .  Sexual activity: Yes    Birth control/protection: Injection  Lifestyle  . Physical activity:    Days per week: Not on file    Minutes per session: Not on file  . Stress: Not on file  Relationships  . Social connections:    Talks on phone: Not on file    Gets together: Not on file    Attends religious service: Not on file    Active member of club or organization: Not on file    Attends meetings of clubs or organizations: Not on file    Relationship status: Not on file  . Intimate partner violence:    Fear of current or ex partner: Not on file    Emotionally abused: Not on file    Physically abused: Not on file    Forced sexual activity: Not on file  Other Topics Concern  . Not on file  Social History Narrative   G1P1   First baby born July 2017      Allergies as of 10/13/2017   No Known Allergies     Medication List        Accurate as of 10/13/17 11:59 PM. Always use your most recent med list.          amLODipine 2.5 MG tablet Commonly known as:  NORVASC Take 1 tablet (2.5 mg total) by mouth daily.   azelastine 0.1 % nasal spray Commonly known as:  ASTELIN Place 2 sprays into both nostrils at bedtime as needed for rhinitis or allergies. Use in each nostril as directed   EPINEPHrine 0.3 mg/0.3 mL Soaj injection Commonly known as:  EPI-PEN Inject 0.3 mg into the muscle once as needed.   levocetirizine 5 MG tablet Commonly known as:  XYZAL Take 1 tablet (5 mg total) by mouth every evening.   multivitamin tablet Take 1 tablet by mouth daily.   SUMAtriptan 50 MG tablet Commonly known as:  IMITREX Take 1 tablet (50 mg total) by mouth every 2 (two) hours as needed for migraine. No more than 2 tabs in a 24 hour period   topiramate 25 MG tablet Commonly known as:  TOPAMAX Take 1 tablet (25 mg total) by mouth 2 (two) times daily.          Objective:   Physical Exam BP 124/66 (BP Location: Left Arm, Patient Position: Sitting, Cuff Size: Small)   Pulse 78    Temp (!) 97.4 F (36.3 C) (Oral)   Resp 16   Ht 5\' 6"  (1.676 m)   Wt 212 lb 4 oz (96.3 kg)   SpO2 97%   BMI 34.26 kg/m  General:   Well developed, NAD, see BMI.  HEENT:  Normocephalic . Face symmetric, atraumatic.  TMs are slightly bulge, not red, more noticeable on the right. Skin: Not pale. Not jaundice Neurologic:  alert & oriented X3.  Speech normal, gait appropriate for age and unassisted. EOMI, motor symmetric. Psych--  Cognition and judgment appear intact.  Cooperative with normal attention span and concentration.  Behavior appropriate. No anxious or depressed appearing.      Assessment HTN Migraines CT head (-) 2008, MRI (-) 2012, h/o response to imitrex ; ER 08/2017, 09/2017 CT head (-) on each visit  H/o  Gestational diabetes  Plan: Migraines: Not well controlled, recommend good hydration, avoid fasting, avoid sleep deprivation. Watch triggers  Will start Topamax, see AVS. Continue Excedrin, Imitrex as needed. Keep the appointment she has with neurology next month. To call if she has any unusual headaches. ET dysfunction: Has muffling sensation @ the  right ear, no evidence of infection, recommend Flonase, Astelin, call if not better.  Today, I spent more than 25   min with the patient: >50% of the time counseling regards migraine treatment, preventive versus acute treatment.  Also reviewing the chart and labs ordered by other providers

## 2017-10-13 NOTE — Progress Notes (Signed)
Pre visit review using our clinic review tool, if applicable. No additional management support is needed unless otherwise documented below in the visit note. 

## 2017-10-14 NOTE — Assessment & Plan Note (Signed)
Migraines: Not well controlled, recommend good hydration, avoid fasting, avoid sleep deprivation. Watch triggers  Will start Topamax, see AVS. Continue Excedrin, Imitrex as needed. Keep the appointment she has with neurology next month. To call if she has any unusual headaches. ET dysfunction: Has muffling sensation @ the  right ear, no evidence of infection, recommend Flonase, Astelin, call if not better.

## 2017-10-23 DIAGNOSIS — E119 Type 2 diabetes mellitus without complications: Secondary | ICD-10-CM | POA: Diagnosis not present

## 2017-10-23 DIAGNOSIS — I10 Essential (primary) hypertension: Secondary | ICD-10-CM | POA: Diagnosis not present

## 2017-11-05 ENCOUNTER — Encounter: Payer: Self-pay | Admitting: Neurology

## 2017-11-05 ENCOUNTER — Ambulatory Visit: Payer: BLUE CROSS/BLUE SHIELD | Admitting: Neurology

## 2017-11-05 VITALS — BP 124/81 | HR 86 | Ht 65.0 in | Wt 214.0 lb

## 2017-11-05 DIAGNOSIS — R51 Headache with orthostatic component, not elsewhere classified: Secondary | ICD-10-CM

## 2017-11-05 DIAGNOSIS — H53453 Other localized visual field defect, bilateral: Secondary | ICD-10-CM

## 2017-11-05 DIAGNOSIS — Z8632 Personal history of gestational diabetes: Secondary | ICD-10-CM

## 2017-11-05 DIAGNOSIS — H9193 Unspecified hearing loss, bilateral: Secondary | ICD-10-CM | POA: Diagnosis not present

## 2017-11-05 DIAGNOSIS — R519 Headache, unspecified: Secondary | ICD-10-CM

## 2017-11-05 DIAGNOSIS — G43111 Migraine with aura, intractable, with status migrainosus: Secondary | ICD-10-CM

## 2017-11-05 DIAGNOSIS — G8929 Other chronic pain: Secondary | ICD-10-CM

## 2017-11-05 DIAGNOSIS — G43711 Chronic migraine without aura, intractable, with status migrainosus: Secondary | ICD-10-CM

## 2017-11-05 DIAGNOSIS — R202 Paresthesia of skin: Secondary | ICD-10-CM

## 2017-11-05 DIAGNOSIS — I10 Essential (primary) hypertension: Secondary | ICD-10-CM

## 2017-11-05 MED ORDER — TOPIRAMATE ER 50 MG PO SPRINKLE CAP24: 50.0000 mg | EXTENDED_RELEASE_CAPSULE | Freq: Every day | ORAL | 0 refills | Status: DC

## 2017-11-05 NOTE — Patient Instructions (Addendum)
MRI brain to evaluate for Idiopathic Intracranial Hypertension (see the literature that was printed out for you  Topiramate extended-release capsules What is this medicine? TOPIRAMATE (toe PYRE a mate) is used to treat seizures in adults or children with epilepsy. It is also used for the prevention of migraine headaches. This medicine may be used for other purposes; ask your health care provider or pharmacist if you have questions. COMMON BRAND NAME(S): Trokendi XR What should I tell my health care provider before I take this medicine? They need to know if you have any of these conditions: -cirrhosis of the liver or liver disease -diarrhea -glaucoma -kidney stones or kidney disease -lung disease like asthma, obstructive pulmonary disease, emphysema -metabolic acidosis -on a ketogenic diet -scheduled for surgery or a procedure -suicidal thoughts, plans, or attempt; a previous suicide attempt by you or a family member -an unusual or allergic reaction to topiramate, other medicines, foods, dyes, or preservatives -pregnant or trying to get pregnant -breast-feeding How should I use this medicine? Take this medicine by mouth with a glass of water. Follow the directions on the prescription label. Trokendi XR capsules must be swallowed whole. Do not sprinkle on food, break, crush, dissolve, or chew. Qudexy XR capsules may be swallowed whole or opened and sprinkled on a small amount of soft food. This mixture must be swallowed immediately. Do not chew or store mixture for later use. You may take this medicine with meals. Take your medicine at regular intervals. Do not take it more often than directed. Talk to your pediatrician regarding the use of this medicine in children. Special care may be needed. While Trokendi XR may be prescribed for children as young as 6 years and Qudexy XR may be prescribed for children as young as 2 years for selected conditions, precautions do apply. Overdosage: If you  think you have taken too much of this medicine contact a poison control center or emergency room at once. NOTE: This medicine is only for you. Do not share this medicine with others. What if I miss a dose? If you miss a dose, take it as soon as you can. If it is almost time for your next dose, take only that dose. Do not take double or extra doses. What may interact with this medicine? Do not take this medicine with any of the following medications: -probenecid This medicine may also interact with the following medications: -acetazolamide -alcohol -amitriptyline -birth control pills -digoxin -hydrochlorothiazide -lithium -medicines for pain, sleep, or muscle relaxation -metformin -methazolamide -other seizure or epilepsy medicines -pioglitazone -risperidone This list may not describe all possible interactions. Give your health care provider a list of all the medicines, herbs, non-prescription drugs, or dietary supplements you use. Also tell them if you smoke, drink alcohol, or use illegal drugs. Some items may interact with your medicine. What should I watch for while using this medicine? Visit your doctor or health care professional for regular checks on your progress. Do not stop taking this medicine suddenly. This increases the risk of seizures if you are using this medicine to control epilepsy. Wear a medical identification bracelet or chain to say you have epilepsy or seizures, and carry a card that lists all your medicines. This medicine can decrease sweating and increase your body temperature. Watch for signs of deceased sweating or fever, especially in children. Avoid extreme heat, hot baths, and saunas. Be careful about exercising, especially in hot weather. Contact your health care provider right away if you notice a fever or  decrease in sweating. You should drink plenty of fluids while taking this medicine. If you have had kidney stones in the past, this will help to reduce your  chances of forming kidney stones. If you have stomach pain, with nausea or vomiting and yellowing of your eyes or skin, call your doctor immediately. You may get drowsy, dizzy, or have blurred vision. Do not drive, use machinery, or do anything that needs mental alertness until you know how this medicine affects you. To reduce dizziness, do not sit or stand up quickly, especially if you are an older patient. Alcohol can increase drowsiness and dizziness. Avoid alcoholic drinks. Do not drink alcohol for 6 hours before or 6 hours after taking Trokendi XR. If you notice blurred vision, eye pain, or other eye problems, seek medical attention at once for an eye exam. The use of this medicine may increase the chance of suicidal thoughts or actions. Pay special attention to how you are responding while on this medicine. Any worsening of mood, or thoughts of suicide or dying should be reported to your health care professional right away. This medicine may increase the chance of developing metabolic acidosis. If left untreated, this can cause kidney stones, bone disease, or slowed growth in children. Symptoms include breathing fast, fatigue, loss of appetite, irregular heartbeat, or loss of consciousness. Call your doctor immediately if you experience any of these side effects. Also, tell your doctor about any surgery you plan on having while taking this medicine since this may increase your risk for metabolic acidosis. Birth control pills may not work properly while you are taking this medicine. Talk to your doctor about using an extra method of birth control. Women who become pregnant while using this medicine may enroll in the [place] American Antiepileptic Drug Pregnancy Registry by calling [phone]. This registry collects information about the safety of antiepileptic drug use during pregnancy. What side effects may I notice from receiving this medicine? Side effects that you should report to your doctor or  health care professional as soon as possible: -allergic reactions like skin rash, itching or hives, swelling of the face, lips, or tongue -decreased sweating and/or rise in body temperature -depression -difficulty breathing, fast or irregular breathing patterns -difficulty speaking -difficulty walking or controlling muscle movements -hearing impairment -redness, blistering, peeling or loosening of the skin, including inside the mouth -tingling, pain or numbness in the hands or feet -unusually weak or tired -worsening of mood, thoughts or actions of suicide or dying Side effects that usually do not require medical attention (report to your doctor or health care professional if they continue or are bothersome): -altered taste -back pain, joint or muscle aches and pains -diarrhea, or constipation -headache -loss of appetite -nausea -stomach upset, indigestion -tremors This list may not describe all possible side effects. Call your doctor for medical advice about side effects. You may report side effects to FDA at 1-800-FDA-1088. Where should I keep my medicine? Keep out of the reach of children. Store at room temperature between 15 and 30 degrees C (59 and 86 degrees F) in a tightly closed container. Protect from moisture. Throw away any unused medicine after the expiration date. NOTE: This sheet is a summary. It may not cover all possible information. If you have questions about this medicine, talk to your doctor, pharmacist, or health care provider.  2018 Elsevier/Gold Standard (2015-04-28 12:33:11)   Migraine Headache A migraine headache is a very strong throbbing pain on one side or both sides of your  head. Migraines can also cause other symptoms. Talk with your doctor about what things may bring on (trigger) your migraine headaches. Follow these instructions at home: Medicines  Take over-the-counter and prescription medicines only as told by your doctor.  Do not drive or use  heavy machinery while taking prescription pain medicine.  To prevent or treat constipation while you are taking prescription pain medicine, your doctor may recommend that you: ? Drink enough fluid to keep your pee (urine) clear or pale yellow. ? Take over-the-counter or prescription medicines. ? Eat foods that are high in fiber. These include fresh fruits and vegetables, whole grains, and beans. ? Limit foods that are high in fat and processed sugars. These include fried and sweet foods. Lifestyle  Avoid alcohol.  Do not use any products that contain nicotine or tobacco, such as cigarettes and e-cigarettes. If you need help quitting, ask your doctor.  Get at least 8 hours of sleep every night.  Limit your stress. General instructions   Keep a journal to find out what may bring on your migraines. For example, write down: ? What you eat and drink. ? How much sleep you get. ? Any change in what you eat or drink. ? Any change in your medicines.  If you have a migraine: ? Avoid things that make your symptoms worse, such as bright lights. ? It may help to lie down in a dark, quiet room. ? Do not drive or use heavy machinery. ? Ask your doctor what activities are safe for you.  Keep all follow-up visits as told by your doctor. This is important. Contact a doctor if:  You get a migraine that is different or worse than your usual migraines. Get help right away if:  Your migraine gets very bad.  You have a fever.  You have a stiff neck.  You have trouble seeing.  Your muscles feel weak or like you cannot control them.  You start to lose your balance a lot.  You start to have trouble walking.  You pass out (faint). This information is not intended to replace advice given to you by your health care provider. Make sure you discuss any questions you have with your health care provider. Document Released: 10/17/2007 Document Revised: 07/28/2015 Document Reviewed:  06/26/2015 Elsevier Interactive Patient Education  2018 [employer] Inc[employer]   Idiopathic Intracranial Hypertension Idiopathic intracranial hypertension (IIH) is a condition that increases pressure around the brain. The fluid that surrounds the brain and spinal cord (cerebrospinal fluid, CSF) increases and causes the pressure. Idiopathic means that the cause of this condition is not known. IIH affects the brain and spinal cord (is a neurological disorder). If this condition is not treated, it can cause vision loss or blindness. What increases the risk? You are more likely to develop this condition if:  You are severely overweight (obese).  You are a woman who has not gone through menopause.  You take certain medicines, such as birth control or steroids.  What are the signs or symptoms? Symptoms of IIH include:  Headaches. This is the most common symptom.  Pain in the shoulders or neck.  Nausea and vomiting.  A "rushing water" or pulsing sound within the ears (pulsatile tinnitus).  Double vision.  Blurred vision.  Brief episodes of complete vision loss.  How is this diagnosed? This condition may be diagnosed based on:  Your symptoms.  Your medical history.  CT scan of the brain.  MRI of the brain.  Magnetic resonance venogram (  MRV) to check veins in the brain.  Diagnostic lumbar puncture. This is a procedure to remove and examine a sample of cerebrospinal fluid. This procedure can determine whether too much fluid may be causing IIH.  A thorough eye exam to check for swelling or nerve damage in the eyes.  How is this treated? Treatment for this condition depends on your symptoms. The goal of treatment is to decrease the pressure around your brain. Common treatments include:  Medicines to decrease the production of spinal fluid and lower the pressure within your skull.  Medicines to prevent or treat headaches.  Surgery to place drains (shunts) in your brain to remove  excess fluid.  Lumbar puncture to remove excess cerebrospinal fluid.  Follow these instructions at home:  If you are overweight or obese, work with your health care provider to lose weight.  Take over-the-counter and prescription medicines only as told by your health care provider.  Do not drive or use heavy machinery while taking medicines that can make you sleepy.  Keep all follow-up visits as told by your health care provider. This is important. Contact a health care provider if:  You have changes in your vision, such as: ? Double vision. ? Not being able to see colors (color vision). Get help right away if:  You have any of the following symptoms and they get worse or do not get better. ? Headaches. ? Nausea. ? Vomiting. ? Vision changes or difficulty seeing. Summary  Idiopathic intracranial hypertension (IIH) is a condition that increases pressure around the brain. The cause is not known (is idiopathic).  The most common symptom of IIH is headaches.  Treatment may include medicines or surgery to relieve the pressure on your brain. This information is not intended to replace advice given to you by your health care provider. Make sure you discuss any questions you have with your health care provider. Document Released: 03/18/2001 Document Revised: 11/29/2015 Document Reviewed: 11/29/2015 Elsevier Interactive Patient Education  2017 [employer].

## 2017-11-05 NOTE — Progress Notes (Signed)
[facility]    Provider:  Dr Lucia Gaskins Referring Provider: Wanda Plump, MD Primary Care Physician:  Wanda Plump, MD  CC:  Migraines  HPI:  Samantha Gould is a 32 y.o. female here as requested by Dr. Drue Novel for migraines. Started at the age of 17. Sister has migraines. Father has headaches. Headaches started back 4 months ago and worsening. She loses vision in both eyes peripherally, she went to the eye doctor and everything was fine. Numbness in the fingers up the left arm and she was nervous. Blurry vision. Gradual and becomes throbbing and sharp, +photophobia/phono/osmophobia. Smells can trigger. +nausea. +vomiting. Always has the vision changes first. Excedrin helps. Movement makes it worse. But these are different than her previous migraines, she feels fluid in her ears which is worse positionally with "wooshing", sinus is a trigger, weather is a trigger, episodes of severely blurry vision.   Reviewed notes, labs and imaging from outside physicians, which showed:  CT head 09/2017 showed No acute intracranial abnormalities including mass lesion or mass effect, hydrocephalus, extra-axial fluid collection, midline shift, hemorrhage, or acute infarction, large ischemic events (personally reviewed images)  CMP with elevated glucose otherwise unremarkable, BUN 12 creatining 0.72   Review of Systems: Patient complains of symptoms per HPI as well as the following symptoms: headache. Pertinent negatives and positives per HPI. All others negative.   Social History   Socioeconomic History  . Marital status: Married    Spouse name: Not on file  . Number of children: 1  . Years of education: 51   . Highest education level: Some college, no degree  Occupational History  . Occupation: [employer]  Social Needs  . Financial resource strain: Not on file  . Food insecurity:    Worry: Not on file    Inability: Not on file  . Transportation needs:    Medical: Not on file   Non-medical: Not on file  Tobacco Use  . Smoking status: Former Smoker    Last attempt to quit: 08/24/2013    Years since quitting: 4.2  . Smokeless tobacco: Never Used  Substance and Sexual Activity  . Alcohol use: No    Alcohol/week: 0.0 standard drinks  . Drug use: No  . Sexual activity: Yes    Birth control/protection: Injection  Lifestyle  . Physical activity:    Days per week: Not on file    Minutes per session: Not on file  . Stress: Not on file  Relationships  . Social connections:    Talks on phone: Not on file    Gets together: Not on file    Attends religious service: Not on file    Active member of club or organization: Not on file    Attends meetings of clubs or organizations: Not on file    Relationship status: Not on file  . Intimate partner violence:    Fear of current or ex partner: Not on file    Emotionally abused: Not on file    Physically abused: Not on file    Forced sexual activity: Not on file  Other Topics Concern  . Not on file  Social History Narrative   G1P1   First baby born July 2017      Lives at home with spouse & son   Right handed   Caffeine: 16 oz daily    Family History  Problem Relation Age of Onset  . Breast cancer Maternal Grandmother   . Diabetes Maternal Grandmother   .  Sleep apnea Father        nasal surgery  . Headache Father   . Sleep apnea Maternal Grandfather   . CAD Maternal Grandfather   . Diabetes Mother   . High blood pressure Mother   . Cancer Paternal Grandmother   . Migraines Sister   . Stroke Neg Hx   . Colon cancer Neg Hx     Past Medical History:  Diagnosis Date  . History of gestational diabetes   . Hypertension    with pregnancy only  . Migraine    migraines; Exedrine Migraine works    Past Surgical History:  Procedure Laterality Date  . NO PAST SURGERIES      Current Outpatient Medications  Medication Sig Dispense Refill  . acetaminophen (TYLENOL) 500 MG tablet Take 500 mg by mouth as  needed.    Marland Kitchen amLODipine (NORVASC) 2.5 MG tablet Take 1 tablet (2.5 mg total) by mouth daily. 30 tablet 6  . Aspirin-Acetaminophen-Caffeine (EXCEDRIN PO) Take by mouth as needed.    Marland Kitchen azelastine (ASTELIN) 0.1 % nasal spray Place 2 sprays into both nostrils at bedtime as needed for rhinitis or allergies. Use in each nostril as directed 30 mL 6  . EPINEPHrine 0.3 mg/0.3 mL IJ SOAJ injection Inject 0.3 mg into the muscle once as needed.  0  . levocetirizine (XYZAL) 5 MG tablet Take 1 tablet (5 mg total) by mouth every evening. (Patient not taking: Reported on 11/05/2017) 30 tablet 5  . SUMAtriptan (IMITREX) 50 MG tablet Take 1 tablet (50 mg total) by mouth every 2 (two) hours as needed for migraine. No more than 2 tabs in a 24 hour period (Patient not taking: Reported on 11/05/2017) 10 tablet 0  . topiramate ER (QUDEXY XR) 50 MG CS24 sprinkle capsule Take 1 capsule (50 mg total) by mouth daily. 60 each 0   No current facility-administered medications for this visit.     Allergies as of 11/05/2017  . (No Known Allergies)    Vitals: BP 124/81 (BP Location: Right Arm, Patient Position: Sitting)   Pulse 86   Ht 5\' 5"  (1.651 m)   Wt 214 lb (97.1 kg)   BMI 35.61 kg/m  Last Weight:  Wt Readings from Last 1 Encounters:  11/05/17 214 lb (97.1 kg)   Last Height:   Ht Readings from Last 1 Encounters:  11/05/17 5\' 5"  (1.651 m)    Physical exam: Exam: Gen: NAD, conversant, well nourised, obese, well groomed                     CV: RRR, no MRG. No Carotid Bruits. No peripheral edema, warm, nontender Eyes: Conjunctivae clear without exudates or hemorrhage  Neuro: Detailed Neurologic Exam  Speech:    Speech is normal; fluent and spontaneous with normal comprehension.  Cognition:    The patient is oriented to person, place, and time;     recent and remote memory intact;     language fluent;     normal attention, concentration,     fund of knowledge Cranial Nerves:    The pupils are  equal, round, and reactive to light. The fundi are normal and spontaneous venous pulsations are present. Visual fields are full to finger confrontation. Extraocular movements are intact. Trigeminal sensation is intact and the muscles of mastication are normal. The face is symmetric. The palate elevates in the midline. Hearing intact. Voice is normal. Shoulder shrug is normal. The tongue has normal motion without fasciculations.  Coordination:    Normal finger to nose and heel to shin. Normal rapid alternating movements.   Gait:    Heel-toe and tandem gait are normal.   Motor Observation:    No asymmetry, no atrophy, and no involuntary movements noted. Tone:    Normal muscle tone.    Posture:    Posture is normal. normal erect    Strength:    Strength is V/V in the upper and lower limbs.      Sensation: intact to LT     Reflex Exam:  DTR's:    Deep tendon reflexes in the upper and lower extremities are normal bilaterally.   Toes:    The toes are downgoing bilaterally.   Clonus:    Clonus is absent.      Assessment/Plan:  32 year old with intractable headaches. Likely chronic migraines however given concerning symptoms such as hearing changes, vision changes, positional quality, obesity need to evaluate for pseudotumor or other etiology  MRI brain due to concerning symptoms of hearing changes, positional headaches,vision changes,left arm sensory changes to look for space occupying mass, chiari or intracranial hypertension (pseudotumor). Also need Multiple Sclerosis protocol given the neurologic complaints.  Start Topiramate for prevention.  Will consider LP if any signs of Pseudotumor on MRI brain  Discussed migraines vs IIH, obesity and risk of vision loss.    Orders Placed This Encounter  Procedures  . MR BRAIN W WO CONTRAST   Meds ordered this encounter  Medications  . topiramate ER (QUDEXY XR) 50 MG CS24 sprinkle capsule    Sig: Take 1 capsule (50 mg total) by  mouth daily.    Dispense:  60 each    Refill:  0    Discussed: To prevent or relieve headaches, try the following: Cool Compress. Lie down and place a cool compress on your head.  Avoid headache triggers. If certain foods or odors seem to have triggered your migraines in the past, avoid them. A headache diary might help you identify triggers.  Include physical activity in your daily routine. Try a daily walk or other moderate aerobic exercise.  Manage stress. Find healthy ways to cope with the stressors, such as delegating tasks on your to-do list.  Practice relaxation techniques. Try deep breathing, yoga, massage and visualization.  Eat regularly. Eating regularly scheduled meals and maintaining a healthy diet might help prevent headaches. Also, drink plenty of fluids.  Follow a regular sleep schedule. Sleep deprivation might contribute to headaches Consider biofeedback. With this mind-body technique, you learn to control certain bodily functions - such as muscle tension, heart rate and blood pressure - to prevent headaches or reduce headache pain.    Proceed to emergency room if you experience new or worsening symptoms or symptoms do not resolve, if you have new neurologic symptoms or if headache is severe, or for any concerning symptom.   Provided education and documentation from American headache Society toolbox including articles on: chronic migraine medication overuse headache, chronic migraines, prevention of migraines, behavioral and other nonpharmacologic treatments for headache.  Cc: Dr. Renaldo Fiddler, MD  [facility] [address] [place], [place] [address]  Phone [phone] Fax [phone]

## 2017-11-06 ENCOUNTER — Telehealth: Payer: Self-pay | Admitting: Neurology

## 2017-11-06 DIAGNOSIS — I10 Essential (primary) hypertension: Secondary | ICD-10-CM | POA: Insufficient documentation

## 2017-11-06 DIAGNOSIS — G8929 Other chronic pain: Secondary | ICD-10-CM | POA: Insufficient documentation

## 2017-11-06 DIAGNOSIS — R51 Headache: Secondary | ICD-10-CM

## 2017-11-06 DIAGNOSIS — Z8632 Personal history of gestational diabetes: Secondary | ICD-10-CM | POA: Insufficient documentation

## 2017-11-06 DIAGNOSIS — G43111 Migraine with aura, intractable, with status migrainosus: Secondary | ICD-10-CM | POA: Insufficient documentation

## 2017-11-06 NOTE — Telephone Encounter (Signed)
MR Brain w/wo contrast Dr. Valaria Good Auth: [id number] (exp. 11/06/17 to 12/05/17). Patient is scheduled for 11/11/17 at [facility].

## 2017-11-10 NOTE — Telephone Encounter (Signed)
Patient r/s due to the ded for 01/27/18.

## 2017-11-11 ENCOUNTER — Other Ambulatory Visit: Payer: BLUE CROSS/BLUE SHIELD

## 2017-11-14 ENCOUNTER — Other Ambulatory Visit: Payer: Self-pay | Admitting: Internal Medicine

## 2017-11-28 ENCOUNTER — Ambulatory Visit (INDEPENDENT_AMBULATORY_CARE_PROVIDER_SITE_OTHER): Payer: BLUE CROSS/BLUE SHIELD

## 2017-11-28 DIAGNOSIS — Z3042 Encounter for surveillance of injectable contraceptive: Secondary | ICD-10-CM

## 2017-11-28 MED ORDER — MEDROXYPROGESTERONE ACETATE 150 MG/ML IM SUSP
150.0000 mg | Freq: Once | INTRAMUSCULAR | Status: AC
  Administered 2017-11-28: 150 mg via INTRAMUSCULAR

## 2017-12-09 ENCOUNTER — Other Ambulatory Visit: Payer: Self-pay | Admitting: Neurology

## 2018-01-26 NOTE — Telephone Encounter (Signed)
Updated [employer] Auth: [id number] (exp. 01/22/18 to 02/20/18)  Patient canceled her MRI on the automatic machine.. I called the patient to see if she meant to cancel or if she wanted to r/s for 01/27/18 appt.

## 2018-01-27 ENCOUNTER — Other Ambulatory Visit: Payer: BLUE CROSS/BLUE SHIELD

## 2018-02-18 ENCOUNTER — Ambulatory Visit: Payer: BLUE CROSS/BLUE SHIELD

## 2018-02-19 ENCOUNTER — Ambulatory Visit (INDEPENDENT_AMBULATORY_CARE_PROVIDER_SITE_OTHER): Payer: BLUE CROSS/BLUE SHIELD | Admitting: *Deleted

## 2018-02-19 DIAGNOSIS — Z3042 Encounter for surveillance of injectable contraceptive: Secondary | ICD-10-CM | POA: Diagnosis not present

## 2018-02-19 MED ORDER — MEDROXYPROGESTERONE ACETATE 150 MG/ML IM SUSP
150.0000 mg | Freq: Once | INTRAMUSCULAR | Status: AC
  Administered 2018-02-19: 150 mg via INTRAMUSCULAR

## 2018-02-19 NOTE — Progress Notes (Addendum)
Patient here today for depo provera injection.  Last injection was done on 11/28/17  Injection given and patient tolerated well.  Next injection due between 05/08/18 and 05/22/18  Appointment scheduled for 05/08/18  Willow Ora, MD

## 2018-02-19 NOTE — Patient Instructions (Signed)
Next injection due between 05/08/18 and 05/22/18

## 2018-02-27 DIAGNOSIS — Z713 Dietary counseling and surveillance: Secondary | ICD-10-CM | POA: Diagnosis not present

## 2018-03-05 DIAGNOSIS — Z713 Dietary counseling and surveillance: Secondary | ICD-10-CM | POA: Diagnosis not present

## 2018-03-09 ENCOUNTER — Other Ambulatory Visit: Payer: Self-pay | Admitting: Internal Medicine

## 2018-03-10 ENCOUNTER — Ambulatory Visit: Payer: BLUE CROSS/BLUE SHIELD | Admitting: Neurology

## 2018-04-20 DIAGNOSIS — I10 Essential (primary) hypertension: Secondary | ICD-10-CM | POA: Diagnosis not present

## 2018-04-20 DIAGNOSIS — E119 Type 2 diabetes mellitus without complications: Secondary | ICD-10-CM | POA: Diagnosis not present

## 2018-04-27 DIAGNOSIS — E7800 Pure hypercholesterolemia, unspecified: Secondary | ICD-10-CM | POA: Diagnosis not present

## 2018-04-27 DIAGNOSIS — E119 Type 2 diabetes mellitus without complications: Secondary | ICD-10-CM | POA: Diagnosis not present

## 2018-04-27 DIAGNOSIS — I10 Essential (primary) hypertension: Secondary | ICD-10-CM | POA: Diagnosis not present

## 2018-05-08 ENCOUNTER — Ambulatory Visit: Payer: BLUE CROSS/BLUE SHIELD

## 2018-06-22 ENCOUNTER — Ambulatory Visit (INDEPENDENT_AMBULATORY_CARE_PROVIDER_SITE_OTHER): Payer: BLUE CROSS/BLUE SHIELD | Admitting: Internal Medicine

## 2018-06-22 ENCOUNTER — Other Ambulatory Visit: Payer: Self-pay

## 2018-06-22 ENCOUNTER — Encounter: Payer: Self-pay | Admitting: Internal Medicine

## 2018-06-22 ENCOUNTER — Telehealth: Payer: Self-pay

## 2018-06-22 VITALS — Ht 65.0 in

## 2018-06-22 DIAGNOSIS — N912 Amenorrhea, unspecified: Secondary | ICD-10-CM

## 2018-06-22 DIAGNOSIS — I10 Essential (primary) hypertension: Secondary | ICD-10-CM

## 2018-06-22 DIAGNOSIS — Z3009 Encounter for other general counseling and advice on contraception: Secondary | ICD-10-CM | POA: Diagnosis not present

## 2018-06-22 NOTE — Telephone Encounter (Signed)
-----   Message from Wanda Plump, MD sent at 06/22/2018  1:15 PM EDT ----- Regarding: Labs and appointments Samantha Gould: enter a urine pregnancy test, DX amenorrheaSherri:Please schedule lab appointment for urinary pregnancy test at her convenienceNeeds an appointment for a physical by September

## 2018-06-22 NOTE — Progress Notes (Signed)
Subjective:    Patient ID: Samantha Gould, female    DOB: 1985-09-15, 33 y.o.   MRN: [id number]  DOS:  06/22/2018 Type of visit - description: Virtual Visit via Video Note  I connected with@ on 06/23/18 at  1:00 PM EDT by a video enabled telemedicine application and verified that I am speaking with the correct person using two identifiers.   THIS ENCOUNTER IS A VIRTUAL VISIT DUE TO COVID-19 - PATIENT WAS NOT SEEN IN THE OFFICE. PATIENT HAS CONSENTED TO VIRTUAL VISIT / TELEMEDICINE VISIT   Location of patient: home  Location of provider: office  I discussed the limitations of evaluation and management by telemedicine and the availability of in person appointments. The patient expressed understanding and agreed to proceed.  History of Present Illness: Acute visit Patient stopped Depo shots as she was looking into having children however she would like to be back on birth control due to COVID-19. Would like a oral medication. HTN: Well controlled except when she eats excessive salt. Headaches: Better, not on Topamax.      Review of Systems  No other concerns, feeling well   Past Medical History:  Diagnosis Date  . History of gestational diabetes   . Hypertension    with pregnancy only  . Migraine    migraines; Exedrine Migraine works    Past Surgical History:  Procedure Laterality Date  . NO PAST SURGERIES      Social History   Socioeconomic History  . Marital status: Married    Spouse name: Not on file  . Number of children: 1  . Years of education: 32   . Highest education level: Some college, no degree  Occupational History  . Occupation: [employer]  Social Needs  . Financial resource strain: Not on file  . Food insecurity:    Worry: Not on file    Inability: Not on file  . Transportation needs:    Medical: Not on file    Non-medical: Not on file  Tobacco Use  . Smoking status: Former Smoker    Last attempt to quit: 08/24/2013    Years since quitting: 4.8   . Smokeless tobacco: Never Used  Substance and Sexual Activity  . Alcohol use: No    Alcohol/week: 0.0 standard drinks  . Drug use: No  . Sexual activity: Yes    Birth control/protection: Injection  Lifestyle  . Physical activity:    Days per week: Not on file    Minutes per session: Not on file  . Stress: Not on file  Relationships  . Social connections:    Talks on phone: Not on file    Gets together: Not on file    Attends religious service: Not on file    Active member of club or organization: Not on file    Attends meetings of clubs or organizations: Not on file    Relationship status: Not on file  . Intimate partner violence:    Fear of current or ex partner: Not on file    Emotionally abused: Not on file    Physically abused: Not on file    Forced sexual activity: Not on file  Other Topics Concern  . Not on file  Social History Narrative   G1P1   First baby born July 2017      Lives at home with spouse & son   Right handed   Caffeine: 16 oz daily      Allergies as of 06/22/2018  No Known Allergies     Medication List       Accurate as of June 22, 2018 11:59 PM. If you have any questions, ask your nurse or doctor.        STOP taking these medications   topiramate 25 MG tablet Commonly known as:  TOPAMAX Stopped by:  Willow OraJose Laneshia Pina, MD   topiramate ER 50 MG Cs24 sprinkle capsule Commonly known as:  Qudexy XR Stopped by:  Willow OraJose Rondrick Barreira, MD     TAKE these medications   acetaminophen 500 MG tablet Commonly known as:  TYLENOL Take 500 mg by mouth as needed.   amLODipine 2.5 MG tablet Commonly known as:  NORVASC Take 1 tablet (2.5 mg total) by mouth daily.   azelastine 0.1 % nasal spray Commonly known as:  ASTELIN Place 2 sprays into both nostrils at bedtime as needed for rhinitis or allergies. Use in each nostril as directed   EPINEPHrine 0.3 mg/0.3 mL Soaj injection Commonly known as:  EPI-PEN Inject 0.3 mg into the muscle once as needed.   EXCEDRIN  PO Take by mouth as needed.   levocetirizine 5 MG tablet Commonly known as:  XYZAL Take 1 tablet (5 mg total) by mouth every evening.   SUMAtriptan 50 MG tablet Commonly known as:  Imitrex Take 1 tablet (50 mg total) by mouth every 2 (two) hours as needed for migraine. No more than 2 tabs in a 24 hour period           Objective:   Physical Exam Ht 5\' 5"  (1.651 m)   BMI 35.61 kg/m  This is a virtual video visit, alert oriented x3, no apparent distress    Assessment       Assessment HTN Migraines CT head (-) 2008, MRI (-) 2012, h/o response to imitrex ; ER 08/2017, 09/2017 CT head (-) on each visit  H/o  Gestational diabetes  Plan: Birth control: Last Depo shot 02/19/2018, still does not have a period; would like to go back on birth control this time she prefers orals. Previously was intolerant to Tri Sprintec due to swelling and fluid retention Plan: UPT at the office, if negative for start other oral birth control. ALESSE would be a good option with less hormones. Also, 2 weeks after she starts BCPs will be anohter UPT, at home or at the office. HTN: Currently on amlodipine, BPs usually 130/80, occasionally diastolic BP go to the 90s depending on diet.  Recommend healthy diet and continue present care Headaches: Took Topamax temporarily, headaches are now better, self DC Topamax, restart if needed.  Has Imitrex as needed Plan: We will schedule a lab visit for a UPT Needs CPX by September 2020

## 2018-06-23 ENCOUNTER — Other Ambulatory Visit (INDEPENDENT_AMBULATORY_CARE_PROVIDER_SITE_OTHER): Payer: BC Managed Care – PPO

## 2018-06-23 ENCOUNTER — Telehealth: Payer: Self-pay

## 2018-06-23 ENCOUNTER — Other Ambulatory Visit: Payer: Self-pay

## 2018-06-23 ENCOUNTER — Other Ambulatory Visit: Payer: Self-pay | Admitting: Internal Medicine

## 2018-06-23 DIAGNOSIS — Z3009 Encounter for other general counseling and advice on contraception: Secondary | ICD-10-CM

## 2018-06-23 DIAGNOSIS — Z789 Other specified health status: Secondary | ICD-10-CM

## 2018-06-23 LAB — POCT URINE PREGNANCY: Preg Test, Ur: NEGATIVE

## 2018-06-23 MED ORDER — LEVONORGESTREL-ETHINYL ESTRAD 0.1-20 MG-MCG PO TABS: 1.0000 | ORAL_TABLET | Freq: Every day | ORAL | 6 refills | Status: DC

## 2018-06-23 NOTE — Assessment & Plan Note (Signed)
Birth control: Last Depo shot 02/19/2018, still does not have a period; would like to go back on birth control this time she prefers orals. Previously was intolerant to Tri Sprintec due to swelling and fluid retention Plan: UPT at the office, if negative for start other oral birth control. ALESSE would be a good option with less hormones. Also, 2 weeks after she starts BCPs will be anohter UPT, at home or at the office. HTN: Currently on amlodipine, BPs usually 130/80, occasionally diastolic BP go to the 90s depending on diet.  Recommend healthy diet and continue present care Headaches: Took Topamax temporarily, headaches are now better, self DC Topamax, restart if needed.  Has Imitrex as needed Plan: We will schedule a lab visit for a UPT Needs CPX by September 2020

## 2018-06-23 NOTE — Telephone Encounter (Signed)
ERROR-- Had to fix Dx code for future test.

## 2018-06-24 ENCOUNTER — Other Ambulatory Visit: Payer: Self-pay

## 2018-06-24 DIAGNOSIS — Z3009 Encounter for other general counseling and advice on contraception: Secondary | ICD-10-CM

## 2018-07-08 ENCOUNTER — Other Ambulatory Visit: Payer: BC Managed Care – PPO

## 2018-07-08 ENCOUNTER — Other Ambulatory Visit (INDEPENDENT_AMBULATORY_CARE_PROVIDER_SITE_OTHER): Payer: BC Managed Care – PPO

## 2018-07-08 ENCOUNTER — Other Ambulatory Visit: Payer: Self-pay

## 2018-07-08 DIAGNOSIS — Z3009 Encounter for other general counseling and advice on contraception: Secondary | ICD-10-CM | POA: Diagnosis not present

## 2018-07-08 LAB — POCT URINE PREGNANCY: Preg Test, Ur: NEGATIVE

## 2018-07-10 ENCOUNTER — Telehealth: Payer: Self-pay

## 2018-07-10 NOTE — Telephone Encounter (Signed)
Copied from [place] [id number]. Topic: General - Other >> Jul 10, 2018 10:00 AM Carolyn Stare wrote:  Pt would like to know the results of her pregancy test

## 2018-07-13 NOTE — Telephone Encounter (Signed)
Spoke w/ Pt- informed of results. Pt verbalized understanding.  

## 2018-08-06 ENCOUNTER — Other Ambulatory Visit: Payer: Self-pay

## 2018-08-07 ENCOUNTER — Encounter: Payer: Self-pay | Admitting: Internal Medicine

## 2018-08-07 ENCOUNTER — Ambulatory Visit (INDEPENDENT_AMBULATORY_CARE_PROVIDER_SITE_OTHER): Payer: BC Managed Care – PPO | Admitting: Internal Medicine

## 2018-08-07 DIAGNOSIS — R05 Cough: Secondary | ICD-10-CM | POA: Diagnosis not present

## 2018-08-07 DIAGNOSIS — R059 Cough, unspecified: Secondary | ICD-10-CM

## 2018-08-07 DIAGNOSIS — J40 Bronchitis, not specified as acute or chronic: Secondary | ICD-10-CM | POA: Diagnosis not present

## 2018-08-07 MED ORDER — HYDROCODONE-HOMATROPINE 5-1.5 MG/5ML PO SYRP: 5.0000 mL | ORAL_SOLUTION | Freq: Every evening | ORAL | 0 refills | Status: DC | PRN

## 2018-08-07 MED ORDER — AZITHROMYCIN 250 MG PO TABS: ORAL_TABLET | ORAL | 0 refills | Status: DC

## 2018-08-07 NOTE — Progress Notes (Signed)
Subjective:    Patient ID: Samantha Gould, female    DOB: 11/17/1985, 33 y.o.   MRN: [id number]  DOS:  08/07/2018 Type of visit - description: Virtual Visit via Video Note  I connected with@ on 08/07/18 at  3:20 PM EDT by a video enabled telemedicine application and verified that I am speaking with the correct person using two identifiers.   THIS ENCOUNTER IS A VIRTUAL VISIT DUE TO COVID-19 - PATIENT WAS NOT SEEN IN THE OFFICE. PATIENT HAS CONSENTED TO VIRTUAL VISIT / TELEMEDICINE VISIT   Location of patient: home  Location of provider: office  I discussed the limitations of evaluation and management by telemedicine and the availability of in person appointments. The patient expressed understanding and agreed to proceed.  History of Present Illness: Acute Symptoms started 2 weeks ago with cough, chest congestion; + headache associated with episodes of cough. For the last 2 days, she is also having some sinus and ear congestion.  + Sputum, only in the mornings.  No hemoptysis.  Taking Mucinex, Sudafed as needed without much help.  Review of Systems Denies fever chills Having chest pain and  difficulty breathing with episodes of cough only. No nausea, vomiting, diarrhea. No loss of the sense of smell or taste No known COVID-19 contacts.  Past Medical History:  Diagnosis Date  . History of gestational diabetes   . Hypertension    with pregnancy only  . Migraine    migraines; Exedrine Migraine works    Past Surgical History:  Procedure Laterality Date  . NO PAST SURGERIES      Social History   Socioeconomic History  . Marital status: Married    Spouse name: Not on file  . Number of children: 1  . Years of education: 80   . Highest education level: Some college, no degree  Occupational History  . Occupation: [employer]  Social Needs  . Financial resource strain: Not on file  . Food insecurity    Worry: Not on file    Inability: Not on file  . Transportation  needs    Medical: Not on file    Non-medical: Not on file  Tobacco Use  . Smoking status: Former Smoker    Quit date: 08/24/2013    Years since quitting: 4.9  . Smokeless tobacco: Never Used  Substance and Sexual Activity  . Alcohol use: No    Alcohol/week: 0.0 standard drinks  . Drug use: No  . Sexual activity: Yes    Birth control/protection: Injection  Lifestyle  . Physical activity    Days per week: Not on file    Minutes per session: Not on file  . Stress: Not on file  Relationships  . Social Herbalist on phone: Not on file    Gets together: Not on file    Attends religious service: Not on file    Active member of club or organization: Not on file    Attends meetings of clubs or organizations: Not on file    Relationship status: Not on file  . Intimate partner violence    Fear of current or ex partner: Not on file    Emotionally abused: Not on file    Physically abused: Not on file    Forced sexual activity: Not on file  Other Topics Concern  . Not on file  Social History Narrative   G1P1   First baby born July 2017      Lives at home with  spouse & son   Right handed   Caffeine: 16 oz daily      Allergies as of 08/07/2018   No Known Allergies     Medication List       Accurate as of August 07, 2018  3:41 PM. If you have any questions, ask your nurse or doctor.        acetaminophen 500 MG tablet Commonly known as: TYLENOL Take 500 mg by mouth as needed.   amLODipine 2.5 MG tablet Commonly known as: NORVASC Take 1 tablet (2.5 mg total) by mouth daily.   azelastine 0.1 % nasal spray Commonly known as: ASTELIN Place 2 sprays into both nostrils at bedtime as needed for rhinitis or allergies. Use in each nostril as directed   EPINEPHrine 0.3 mg/0.3 mL Soaj injection Commonly known as: EPI-PEN Inject 0.3 mg into the muscle once as needed.   EXCEDRIN PO Take by mouth as needed.   levocetirizine 5 MG tablet Commonly known as: XYZAL Take 1  tablet (5 mg total) by mouth every evening.   levonorgestrel-ethinyl estradiol 0.1-20 MG-MCG tablet Commonly known as: ALESSE Take 1 tablet by mouth daily.   SUMAtriptan 50 MG tablet Commonly known as: Imitrex Take 1 tablet (50 mg total) by mouth every 2 (two) hours as needed for migraine. No more than 2 tabs in a 24 hour period           Objective:   Physical Exam There were no vitals taken for this visit. This is a virtual video visit. The patient is alert oriented x3, speaking complete sentences, in no respiratory distress.  I did notice occasional cough and I was able to hear some chest rattling.     Assessment       Assessment HTN Migraines CT head (-) 2008, MRI (-) 2012, h/o response to imitrex ; ER 08/2017, 09/2017 CT head (-) on each visit  H/o  Gestational diabetes  Plan: Bronchitis: Suspect bronchitis but Covid-19 is in the DDX.  It is too late to send her to be tested for COVID-19 today. She does not seem to be in distress Plan: Continue supportive treatment with fluids, rest. Continue Mucinex DM, at hydrocodone at nighttime, will make her drowsy Start a Z-Pak.   Knows to use protection  in addition to BCP while on antibiotics Chest x-ray today ER if worse If not gradually better will check for COVID-19 next week, she will let me know. Advised to protect her family using a mask while they are in the same room. Message with a summary of my instructions sent

## 2018-08-08 NOTE — Assessment & Plan Note (Signed)
Plan: Bronchitis: Suspect bronchitis but Covid-19 is in the DDX.  It is too late to send her to be tested for COVID-19 today. She does not seem to be in distress Plan: Continue supportive treatment with fluids, rest. Continue Mucinex DM, at hydrocodone at nighttime, will make her drowsy Start a Z-Pak.   Knows to use protection  in addition to BCP while on antibiotics Chest x-ray today ER if worse If not gradually better will check for COVID-19 next week, she will let me know. Advised to protect her family using a mask while they are in the same room. Message with a summary of my instructions sent

## 2018-08-10 ENCOUNTER — Ambulatory Visit (HOSPITAL_BASED_OUTPATIENT_CLINIC_OR_DEPARTMENT_OTHER)
Admission: RE | Admit: 2018-08-10 | Discharge: 2018-08-10 | Disposition: A | Discharge status: Home / Self Care | Payer: BC Managed Care – PPO | Source: Ambulatory Visit | Attending: Internal Medicine | Admitting: Internal Medicine

## 2018-08-10 ENCOUNTER — Other Ambulatory Visit: Payer: Self-pay

## 2018-08-10 DIAGNOSIS — R05 Cough: Secondary | ICD-10-CM | POA: Insufficient documentation

## 2018-08-23 ENCOUNTER — Other Ambulatory Visit: Payer: Self-pay | Admitting: Internal Medicine

## 2018-09-11 ENCOUNTER — Encounter: Payer: BLUE CROSS/BLUE SHIELD | Admitting: Internal Medicine

## 2018-10-06 ENCOUNTER — Encounter: Payer: BC Managed Care – PPO | Admitting: Internal Medicine

## 2018-10-06 ENCOUNTER — Ambulatory Visit (INDEPENDENT_AMBULATORY_CARE_PROVIDER_SITE_OTHER): Payer: BC Managed Care – PPO | Admitting: Internal Medicine

## 2018-10-06 ENCOUNTER — Encounter: Payer: Self-pay | Admitting: Internal Medicine

## 2018-10-06 ENCOUNTER — Other Ambulatory Visit: Payer: Self-pay

## 2018-10-06 DIAGNOSIS — J069 Acute upper respiratory infection, unspecified: Secondary | ICD-10-CM

## 2018-10-06 DIAGNOSIS — Z20828 Contact with and (suspected) exposure to other viral communicable diseases: Secondary | ICD-10-CM | POA: Diagnosis not present

## 2018-10-06 DIAGNOSIS — Z20822 Contact with and (suspected) exposure to covid-19: Secondary | ICD-10-CM

## 2018-10-06 NOTE — Progress Notes (Signed)
Subjective:    Patient ID: Samantha Gould, female    DOB: July 22, 1985, 33 y.o.   MRN: [id number]  DOS:  10/06/2018 Type of visit - description: Virtual Visit via Video Note  I connected with@   by a video enabled telemedicine application and verified that I am speaking with the correct person using two identifiers.   THIS ENCOUNTER IS A VIRTUAL VISIT DUE TO COVID-19 - PATIENT WAS NOT SEEN IN THE OFFICE. PATIENT HAS CONSENTED TO VIRTUAL VISIT / TELEMEDICINE VISIT   Location of patient: home  Location of provider: office  I discussed the limitations of evaluation and management by telemedicine and the availability of in person appointments. The patient expressed understanding and agreed to proceed.  History of Present Illness:   Acute Symptoms of started 3 days ago.  Her whole family got sick, everybody else is better but the patient continue with symptoms: Dry cough, severe nasal congestion which is causing difficulty breathing. Blowing very little green or yellow nasal discharge. + Postnasal dripping. She has been taking Mucinex and Tylenol.    Review of Systems Denies fever chills No nausea, vomiting, diarrhea Had myalgias the first 2 days, they resolved. Has headaches, on and off, nothing unusual. No rash Denies allergy symptoms such as sneezing, itchy eyes or itchy nose.   Past Medical History:  Diagnosis Date  . History of gestational diabetes   . Hypertension    with pregnancy only  . Migraine    migraines; Exedrine Migraine works    Past Surgical History:  Procedure Laterality Date  . NO PAST SURGERIES      Social History   Socioeconomic History  . Marital status: Married    Spouse name: Not on file  . Number of children: 1  . Years of education: 17   . Highest education level: Some college, no degree  Occupational History  . Occupation: [employer]  Social Needs  . Financial resource strain: Not on file  . Food insecurity    Worry: Not on file   Inability: Not on file  . Transportation needs    Medical: Not on file    Non-medical: Not on file  Tobacco Use  . Smoking status: Former Smoker    Quit date: 08/24/2013    Years since quitting: 5.1  . Smokeless tobacco: Never Used  Substance and Sexual Activity  . Alcohol use: No    Alcohol/week: 0.0 standard drinks  . Drug use: No  . Sexual activity: Yes    Birth control/protection: Injection  Lifestyle  . Physical activity    Days per week: Not on file    Minutes per session: Not on file  . Stress: Not on file  Relationships  . Social Herbalist on phone: Not on file    Gets together: Not on file    Attends religious service: Not on file    Active member of club or organization: Not on file    Attends meetings of clubs or organizations: Not on file    Relationship status: Not on file  . Intimate partner violence    Fear of current or ex partner: Not on file    Emotionally abused: Not on file    Physically abused: Not on file    Forced sexual activity: Not on file  Other Topics Concern  . Not on file  Social History Narrative   G1P1   First baby born July 2017      Lives at  home with spouse & son   Right handed   Caffeine: 16 oz daily      Allergies as of 10/06/2018   No Known Allergies     Medication List       Accurate as of October 06, 2018  2:45 PM. If you have any questions, ask your nurse or doctor.        acetaminophen 500 MG tablet Commonly known as: TYLENOL Take 500 mg by mouth as needed.   amLODipine 2.5 MG tablet Commonly known as: NORVASC Take 1 tablet (2.5 mg total) by mouth daily.   azelastine 0.1 % nasal spray Commonly known as: ASTELIN Place 2 sprays into both nostrils at bedtime as needed for rhinitis or allergies. Use in each nostril as directed   azithromycin 250 MG tablet Commonly known as: Zithromax Z-Pak 2 tabs a day the first day, then 1 tab a day x 4 days   EPINEPHrine 0.3 mg/0.3 mL Soaj injection Commonly  known as: EPI-PEN Inject 0.3 mg into the muscle once as needed.   EXCEDRIN PO Take by mouth as needed.   HYDROcodone-homatropine 5-1.5 MG/5ML syrup Commonly known as: HYCODAN Take 5 mLs by mouth at bedtime as needed for cough.   levocetirizine 5 MG tablet Commonly known as: XYZAL Take 1 tablet (5 mg total) by mouth every evening.   levonorgestrel-ethinyl estradiol 0.1-20 MG-MCG tablet Commonly known as: ALESSE Take 1 tablet by mouth daily.   SUMAtriptan 50 MG tablet Commonly known as: Imitrex Take 1 tablet (50 mg total) by mouth every 2 (two) hours as needed for migraine. No more than 2 tabs in a 24 hour period           Objective:   Physical Exam There were no vitals taken for this visit. This is a virtual video visit, she is alert oriented x3, her voice is very nasal indicating a lot of nasal congestion but she is in no distress, no cough noted.    Assessment       Assessment HTN Migraines CT head (-) 2008, MRI (-) 2012, h/o response to imitrex ; ER 08/2017, 09/2017 CT head (-) on each visit  H/o  Gestational diabetes  Plan: URI: The patient has upper respiratory symptoms for 3 days, the whole family got sick, they are better except the patient.  The shortness of breath is mostly from inability to breathe through the nose due to congestion. Plan: COVID-19 testing, pretest possibility low Mucinex, Tylenol, Astelin, pseudoephedrine.  See AVS    I discussed the assessment and treatment plan with the patient. The patient was provided an opportunity to ask questions and all were answered. The patient agreed with the plan and demonstrated an understanding of the instructions.   The patient was advised to call back or seek an in-person evaluation if the symptoms worsen or if the condition fails to improve as anticipated.

## 2018-10-07 NOTE — Assessment & Plan Note (Signed)
URI: The patient has upper respiratory symptoms for 3 days, the whole family got sick, they are better except the patient.  The shortness of breath is mostly from inability to breathe through the nose due to congestion. Plan: COVID-19 testing, pretest possibility low Mucinex, Tylenol, Astelin, pseudoephedrine.  See AVS

## 2018-10-26 DIAGNOSIS — E119 Type 2 diabetes mellitus without complications: Secondary | ICD-10-CM | POA: Diagnosis not present

## 2018-10-26 DIAGNOSIS — I10 Essential (primary) hypertension: Secondary | ICD-10-CM | POA: Diagnosis not present

## 2018-10-26 DIAGNOSIS — E7800 Pure hypercholesterolemia, unspecified: Secondary | ICD-10-CM | POA: Diagnosis not present

## 2018-11-06 ENCOUNTER — Encounter: Payer: BC Managed Care – PPO | Admitting: Internal Medicine

## 2019-03-10 DIAGNOSIS — E7800 Pure hypercholesterolemia, unspecified: Secondary | ICD-10-CM | POA: Diagnosis not present

## 2019-03-10 DIAGNOSIS — E119 Type 2 diabetes mellitus without complications: Secondary | ICD-10-CM | POA: Diagnosis not present

## 2019-03-16 DIAGNOSIS — E119 Type 2 diabetes mellitus without complications: Secondary | ICD-10-CM | POA: Diagnosis not present

## 2019-03-16 DIAGNOSIS — E7800 Pure hypercholesterolemia, unspecified: Secondary | ICD-10-CM | POA: Diagnosis not present

## 2019-03-16 DIAGNOSIS — I10 Essential (primary) hypertension: Secondary | ICD-10-CM | POA: Diagnosis not present

## 2019-03-22 DIAGNOSIS — Z1151 Encounter for screening for human papillomavirus (HPV): Secondary | ICD-10-CM | POA: Diagnosis not present

## 2019-03-22 DIAGNOSIS — Z01419 Encounter for gynecological examination (general) (routine) without abnormal findings: Secondary | ICD-10-CM | POA: Diagnosis not present

## 2019-03-22 DIAGNOSIS — L680 Hirsutism: Secondary | ICD-10-CM | POA: Diagnosis not present

## 2019-03-23 DIAGNOSIS — Z01419 Encounter for gynecological examination (general) (routine) without abnormal findings: Secondary | ICD-10-CM | POA: Diagnosis not present

## 2019-03-23 DIAGNOSIS — Z1151 Encounter for screening for human papillomavirus (HPV): Secondary | ICD-10-CM | POA: Diagnosis not present

## 2019-03-23 LAB — HM PAP SMEAR: HM Pap smear: NORMAL

## 2019-03-26 DIAGNOSIS — N926 Irregular menstruation, unspecified: Secondary | ICD-10-CM | POA: Diagnosis not present

## 2019-03-26 DIAGNOSIS — L680 Hirsutism: Secondary | ICD-10-CM | POA: Diagnosis not present

## 2019-05-06 ENCOUNTER — Encounter (HOSPITAL_BASED_OUTPATIENT_CLINIC_OR_DEPARTMENT_OTHER): Payer: Self-pay | Admitting: Emergency Medicine

## 2019-05-06 ENCOUNTER — Emergency Department (HOSPITAL_BASED_OUTPATIENT_CLINIC_OR_DEPARTMENT_OTHER)
Admission: EM | Admit: 2019-05-06 | Discharge: 2019-05-06 | Disposition: A | Discharge status: Home / Self Care | Payer: BC Managed Care – PPO | Attending: Emergency Medicine | Admitting: Emergency Medicine

## 2019-05-06 ENCOUNTER — Emergency Department (HOSPITAL_BASED_OUTPATIENT_CLINIC_OR_DEPARTMENT_OTHER): Payer: BC Managed Care – PPO

## 2019-05-06 ENCOUNTER — Other Ambulatory Visit: Payer: Self-pay | Admitting: Obstetrics and Gynecology

## 2019-05-06 ENCOUNTER — Other Ambulatory Visit: Payer: Self-pay

## 2019-05-06 DIAGNOSIS — R1031 Right lower quadrant pain: Secondary | ICD-10-CM

## 2019-05-06 DIAGNOSIS — Z7984 Long term (current) use of oral hypoglycemic drugs: Secondary | ICD-10-CM | POA: Insufficient documentation

## 2019-05-06 DIAGNOSIS — Z87891 Personal history of nicotine dependence: Secondary | ICD-10-CM | POA: Diagnosis not present

## 2019-05-06 DIAGNOSIS — I10 Essential (primary) hypertension: Secondary | ICD-10-CM | POA: Diagnosis not present

## 2019-05-06 DIAGNOSIS — R109 Unspecified abdominal pain: Secondary | ICD-10-CM

## 2019-05-06 DIAGNOSIS — R197 Diarrhea, unspecified: Secondary | ICD-10-CM | POA: Insufficient documentation

## 2019-05-06 DIAGNOSIS — R110 Nausea: Secondary | ICD-10-CM | POA: Diagnosis not present

## 2019-05-06 DIAGNOSIS — F419 Anxiety disorder, unspecified: Secondary | ICD-10-CM | POA: Insufficient documentation

## 2019-05-06 DIAGNOSIS — Z79899 Other long term (current) drug therapy: Secondary | ICD-10-CM | POA: Diagnosis not present

## 2019-05-06 DIAGNOSIS — R1030 Lower abdominal pain, unspecified: Secondary | ICD-10-CM | POA: Diagnosis not present

## 2019-05-06 DIAGNOSIS — N83202 Unspecified ovarian cyst, left side: Secondary | ICD-10-CM

## 2019-05-06 DIAGNOSIS — K5900 Constipation, unspecified: Secondary | ICD-10-CM | POA: Diagnosis not present

## 2019-05-06 DIAGNOSIS — R1032 Left lower quadrant pain: Secondary | ICD-10-CM | POA: Diagnosis not present

## 2019-05-06 LAB — COMPREHENSIVE METABOLIC PANEL
ALT: 23 U/L (ref 0–44)
AST: 18 U/L (ref 15–41)
Albumin: 4.1 g/dL (ref 3.5–5.0)
Alkaline Phosphatase: 62 U/L (ref 38–126)
Anion gap: 11 (ref 5–15)
BUN: 12 mg/dL (ref 6–20)
CO2: 22 mmol/L (ref 22–32)
Calcium: 9.3 mg/dL (ref 8.9–10.3)
Chloride: 105 mmol/L (ref 98–111)
Creatinine, Ser: 0.76 mg/dL (ref 0.44–1.00)
GFR calc Af Amer: >60 mL/min (ref >60)
GFR calc non Af Amer: >60 mL/min (ref >60)
Glucose, Bld: 116 mg/dL — ABNORMAL HIGH (ref 70–99)
Potassium: 4.3 mmol/L (ref 3.5–5.1)
Sodium: 138 mmol/L (ref 135–145)
Total Bilirubin: 0.9 mg/dL (ref 0.3–1.2)
Total Protein: 7.4 g/dL (ref 6.5–8.1)

## 2019-05-06 LAB — CBC WITH DIFFERENTIAL/PLATELET
Abs Immature Granulocytes: 0.03 10*3/uL (ref 0.00–0.07)
Basophils Absolute: 0.1 10*3/uL (ref 0.0–0.1)
Basophils Relative: 1 %
Eosinophils Absolute: 0.2 10*3/uL (ref 0.0–0.5)
Eosinophils Relative: 1 %
HCT: 44.9 % (ref 36.0–46.0)
Hemoglobin: 14.7 g/dL (ref 12.0–15.0)
Immature Granulocytes: 0 %
Lymphocytes Relative: 29 %
Lymphs Abs: 3.1 10*3/uL (ref 0.7–4.0)
MCH: 30 pg (ref 26.0–34.0)
MCHC: 32.7 g/dL (ref 30.0–36.0)
MCV: 91.6 fL (ref 80.0–100.0)
Monocytes Absolute: 0.7 10*3/uL (ref 0.1–1.0)
Monocytes Relative: 7 %
Neutro Abs: 6.5 10*3/uL (ref 1.7–7.7)
Neutrophils Relative %: 62 %
Platelets: 292 10*3/uL (ref 150–400)
RBC: 4.9 MIL/uL (ref 3.87–5.11)
RDW: 12.4 % (ref 11.5–15.5)
WBC: 10.5 10*3/uL (ref 4.0–10.5)
nRBC: 0 % (ref 0.0–0.2)

## 2019-05-06 LAB — URINALYSIS, ROUTINE W REFLEX MICROSCOPIC
Bilirubin Urine: NEGATIVE
Glucose, UA: NEGATIVE mg/dL
Hgb urine dipstick: NEGATIVE
Ketones, ur: NEGATIVE mg/dL
Nitrite: NEGATIVE
Protein, ur: NEGATIVE mg/dL
Specific Gravity, Urine: >1.03 — ABNORMAL HIGH (ref 1.005–1.030)
pH: 5.5 (ref 5.0–8.0)

## 2019-05-06 LAB — WET PREP, GENITAL
Clue Cells Wet Prep HPF POC: NONE SEEN
Sperm: NONE SEEN
Trich, Wet Prep: NONE SEEN
Yeast Wet Prep HPF POC: NONE SEEN

## 2019-05-06 LAB — URINALYSIS, MICROSCOPIC (REFLEX)

## 2019-05-06 LAB — LIPASE, BLOOD: Lipase: 22 U/L (ref 11–51)

## 2019-05-06 LAB — PREGNANCY, URINE: Preg Test, Ur: NEGATIVE

## 2019-05-06 MED ORDER — FENTANYL CITRATE (PF) 100 MCG/2ML IJ SOLN
100.0000 ug | Freq: Once | INTRAMUSCULAR | Status: AC
  Administered 2019-05-06: 100 ug via INTRAVENOUS
  Filled 2019-05-06: qty 2

## 2019-05-06 MED ORDER — ONDANSETRON HCL 4 MG/2ML IJ SOLN
4.0000 mg | Freq: Once | INTRAMUSCULAR | Status: AC
  Administered 2019-05-06: 4 mg via INTRAVENOUS
  Filled 2019-05-06: qty 2

## 2019-05-06 MED ORDER — IOHEXOL 300 MG/ML  SOLN
100.0000 mL | Freq: Once | INTRAMUSCULAR | Status: AC | PRN
  Administered 2019-05-06: 100 mL via INTRAVENOUS

## 2019-05-06 NOTE — ED Triage Notes (Addendum)
Sharp lower abd pain that started after urinating at 6am. Denies N/V/D. Also reports 2 months of excruciating pain every time she has to have a BM

## 2019-05-06 NOTE — ED Notes (Signed)
RN at bedside with EDP for rectal exam.

## 2019-05-06 NOTE — ED Provider Notes (Signed)
[facility] EMERGENCY DEPARTMENT Provider Note   CSN: [id number] Arrival date & time: 05/06/19  0746     History Chief Complaint  Patient presents with  . Abdominal Pain    Samantha Gould is a 34 y.o. female.  34yo F w/ PMH including HTN, gestational diabetes, obesity, migraines who presents with abdominal pain.  Patient reports that for the past few months, she has had intermittent lower abdominal pain just before she defecates.  She has pain before and during the bowel movement but pain is relieved after bowel movement.  She reports history of constipation but it sometimes alternates with diarrhea.  No blood in her stool.  Last night, she had an episode of diarrhea but denies any associated pain.  She did have some nausea then.  This morning, she woke up and urinated and then began having severe lower abdominal pain.  She does not feel like she needs to have a bowel movement.  She denies any vaginal bleeding or discharge.  No urinary symptoms, nausea/vomiting, fevers, or recent illness.  Of note, she is on Metformin and it was recently increased from 500mg  to 2000mg .   The history is provided by the patient.  Abdominal Pain      Past Medical History:  Diagnosis Date  . History of gestational diabetes   . Hypertension    with pregnancy only  . Migraine    migraines; Exedrine Migraine works    Patient Active Problem List   Diagnosis Date Noted  . Intractable migraine with aura with status migrainosus 11/06/2017  . Chronic intractable headache 11/06/2017  . Hypertension   . History of gestational diabetes   . Hx of gestational diabetes mellitus, not currently pregnant 01/15/2017  . Postpartum care following vaginal delivery (7/26) 08/16/2015  . Preterm premature rupture of membranes 08/15/2015  . Indication for care in labor or delivery 08/15/2015  . PCP NOTES >>> 10/26/2014  . Annual physical exam 02/11/2011  . Migraine without aura 11/07/2006    Past  Surgical History:  Procedure Laterality Date  . NO PAST SURGERIES       OB History    Gravida  1   Para  1   Term  0   Preterm  1   AB  0   Living  1     SAB  0   TAB  0   Ectopic  0   Multiple  0   Live Births  1           Family History  Problem Relation Age of Onset  . Breast cancer Maternal Grandmother   . Diabetes Maternal Grandmother   . Sleep apnea Father        nasal surgery  . Headache Father   . Sleep apnea Maternal Grandfather   . CAD Maternal Grandfather   . Diabetes Mother   . High blood pressure Mother   . Cancer Paternal Grandmother   . Migraines Sister   . Stroke Neg Hx   . Colon cancer Neg Hx     Social History   Tobacco Use  . Smoking status: Former Smoker    Quit date: 08/24/2013    Years since quitting: 5.7  . Smokeless tobacco: Never Used  Substance Use Topics  . Alcohol use: No    Alcohol/week: 0.0 standard drinks  . Drug use: No    Home Medications Prior to Admission medications   Medication Sig Start Date End Date Taking? Authorizing Provider  metFORMIN (GLUCOPHAGE) 500 MG tablet Take by mouth 2 (two) times daily with a meal.   Yes [provider]  acetaminophen (TYLENOL) 500 MG tablet Take 500 mg by mouth as needed.    [provider]  amLODipine (NORVASC) 2.5 MG tablet Take 1 tablet (2.5 mg total) by mouth daily. 08/24/18   Wanda Plump, MD  Aspirin-Acetaminophen-Caffeine (EXCEDRIN PO) Take by mouth as needed.    [provider]  azelastine (ASTELIN) 0.1 % nasal spray Place 2 sprays into both nostrils at bedtime as needed for rhinitis or allergies. Use in each nostril as directed 10/13/17   Wanda Plump, MD  EPINEPHrine 0.3 mg/0.3 mL IJ SOAJ injection Inject 0.3 mg into the muscle once as needed. 02/24/17   [provider]  HYDROcodone-homatropine (HYCODAN) 5-1.5 MG/5ML syrup Take 5 mLs by mouth at bedtime as needed for cough. 08/07/18   Wanda Plump, MD  levocetirizine (XYZAL) 5 MG tablet  Take 1 tablet (5 mg total) by mouth every evening. 04/30/17   Wanda Plump, MD  levonorgestrel-ethinyl estradiol (ALESSE) 0.1-20 MG-MCG tablet Take 1 tablet by mouth daily. 06/23/18   Wanda Plump, MD  SUMAtriptan (IMITREX) 50 MG tablet Take 1 tablet (50 mg total) by mouth every 2 (two) hours as needed for migraine. No more than 2 tabs in a 24 hour period Patient not taking: Reported on 11/05/2017 03/31/17   Wanda Plump, MD    Allergies    Patient has no known allergies.  Review of Systems   Review of Systems  Gastrointestinal: Positive for abdominal pain.   All other systems reviewed and are negative except that which was mentioned in HPI  Physical Exam Updated Vital Signs BP (!) 125/91 (BP Location: Right Arm)   Pulse 97   Temp 98 F (36.7 C) (Oral)   Resp 18   Ht 5\' 5"  (1.651 m)   Wt 101.6 kg   LMP 04/19/2019   SpO2 99%   BMI 37.28 kg/m   Physical Exam Vitals and nursing note reviewed. Exam conducted with a chaperone present.  Constitutional:      Appearance: She is well-developed.     Comments: Uncomfortable, sitting up and leaning forward  HENT:     Head: Normocephalic and atraumatic.     Mouth/Throat:     Mouth: Mucous membranes are moist.     Pharynx: Oropharynx is clear.  Eyes:     Conjunctiva/sclera: Conjunctivae normal.  Cardiovascular:     Rate and Rhythm: Normal rate and regular rhythm.     Heart sounds: Normal heart sounds. No murmur.  Pulmonary:     Breath sounds: Normal breath sounds.     Comments: Mildly hyperventilating 2/2 pain Abdominal:     General: Bowel sounds are normal. There is no distension.     Palpations: Abdomen is soft.     Tenderness: There is abdominal tenderness in the right lower quadrant. There is no guarding or rebound. Negative signs include Murphy's sign.  Genitourinary:    Comments: No external hemorrhoids or fissures, no perianal fluctuance or tenderness; erythema and irritation of cervix, scant discharge, no CMT or adnexal  tenderness Musculoskeletal:     Cervical back: Neck supple.  Skin:    General: Skin is warm and dry.  Neurological:     Mental Status: She is alert and oriented to person, place, and time.     Comments: Fluent speech  Psychiatric:        Mood and Affect:  Mood is anxious.        Judgment: Judgment normal.     ED Results / Procedures / Treatments   Labs (all labs ordered are listed, but only abnormal results are displayed) Labs Reviewed  WET PREP, GENITAL - Abnormal; Notable for the following components:      Result Value   WBC, Wet Prep HPF POC MODERATE (*)    All other components within normal limits  COMPREHENSIVE METABOLIC PANEL - Abnormal; Notable for the following components:   Glucose, Bld 116 (*)    All other components within normal limits  URINALYSIS, ROUTINE W REFLEX MICROSCOPIC - Abnormal; Notable for the following components:   APPearance CLOUDY (*)    Specific Gravity, Urine >1.030 (*)    Leukocytes,Ua SMALL (*)    All other components within normal limits  URINALYSIS, MICROSCOPIC (REFLEX) - Abnormal; Notable for the following components:   Bacteria, UA MANY (*)    All other components within normal limits  URINE CULTURE  LIPASE, BLOOD  CBC WITH DIFFERENTIAL/PLATELET  PREGNANCY, URINE  GC/CHLAMYDIA PROBE AMP ([place]) NOT AT [facility]    EKG None  Radiology CT Abdomen Pelvis W Contrast  Result Date: 05/06/2019 CLINICAL DATA:  Suspected right lower quadrant pain, evaluate for abscess/infection. Pain beginning this morning. EXAM: CT ABDOMEN AND PELVIS WITH CONTRAST TECHNIQUE: Multidetector CT imaging of the abdomen and pelvis was performed using the standard protocol following bolus administration of intravenous contrast. CONTRAST:  160mL OMNIPAQUE IOHEXOL 300 MG/ML  SOLN COMPARISON:  None. FINDINGS: Lower chest: Lung bases are clear. Hepatobiliary: Liver, gallbladder and biliary tree are normal. Pancreas: Normal. Spleen: Normal. Adrenals/Urinary Tract: Adrenal  glands are normal. Kidneys are normal in size without hydronephrosis or nephrolithiasis. No focal mass. Subcentimeter hypodensity over the lower pole right kidney too small to characterize but likely cyst. Ureters and bladder are unremarkable. Stomach/Bowel: Stomach and small bowel are normal. Appendix is located over the midline lower abdomen/pelvis and is normal. Colon is normal. Vascular/Lymphatic: Abdominal aorta is normal caliber. Remaining vascular structures are unremarkable. No adenopathy. Reproductive: Uterus and right ovary normal. Slightly enlarged cystic appearance to the left ovary measuring 5.6 x 6.1 x 5.9 cm in size. Mild associated free fluid in the anterior pelvis and patchy free fluid over the left lower quadrant. Other: None. Musculoskeletal: Unremarkable. IMPRESSION: 1. Slightly enlarged cystic appearance to the left ovary with adjacent free fluid over the anterior pelvis and left lower quadrant. Findings are nonspecific and could be seen due to tubo-ovarian abscess, hemorrhagic cyst, follicular cyst, endometrioma and less likely neoplasm or ectopic pregnancy. Recommend clinical correlation and further evaluation with pelvic ultrasound. 2. Subcentimeter hypodensity over the lower pole right renal cortex too small to characterize but likely a cyst. Electronically Signed   By: Marin Olp M.D.   On: 05/06/2019 10:23   US PELVIC COMPLETE W TRANSVAGINAL AND TORSION R/O  Result Date: 05/06/2019 CLINICAL DATA:  Pelvic pain EXAM: TRANSABDOMINAL AND TRANSVAGINAL ULTRASOUND OF PELVIS DOPPLER ULTRASOUND OF OVARIES TECHNIQUE: Both transabdominal and transvaginal ultrasound examinations of the pelvis were performed. Transabdominal technique was performed for global imaging of the pelvis including uterus, ovaries, adnexal regions, and pelvic cul-de-sac. It was necessary to proceed with endovaginal exam following the transabdominal exam to visualize the uterus, endometrium, ovaries and adnexa. Color and  duplex Doppler ultrasound was utilized to evaluate blood flow to the ovaries. COMPARISON:  None. FINDINGS: Uterus Measurements: 9.5 x 4.4 x 6.0 cm = volume: 129 mL. No fibroids or other mass visualized.  Endometrium Thickness: 9 mm in thickness.  No focal abnormality visualized. Right ovary Measurements: 4.3 x 2.7 x 3.4 cm = volume: 20 mL. Normal appearance/no adnexal mass. Left ovary Measurements: 6.8 x 5.0 x 5.7 cm = volume: 101 mL. Two cysts within the left ovary measuring up to 5.7 cm and 1.8 cm. These appear simple/benign. No internal blood flow within the cysts. Pulsed Doppler evaluation of both ovaries demonstrates normal low-resistance arterial and venous waveforms. Other findings No abnormal free fluid. IMPRESSION: Left ovarian cysts, the largest measuring 5.7 cm. No evidence of torsion. Electronically Signed   By: Charlett Nose M.D.   On: 05/06/2019 11:26    Procedures Procedures (including critical care time)  Medications Ordered in ED Medications  ondansetron (ZOFRAN) injection 4 mg (4 mg Intravenous Given 05/06/19 0816)  fentaNYL (SUBLIMAZE) injection 100 mcg (100 mcg Intravenous Given 05/06/19 0816)  iohexol (OMNIPAQUE) 300 MG/ML solution 100 mL (100 mLs Intravenous Contrast Given 05/06/19 0177)    ED Course  I have reviewed the triage vital signs and the nursing notes.  Pertinent labs & imaging results that were available during my care of the patient were reviewed by me and considered in my medical decision making (see chart for details).    MDM Rules/Calculators/A&P                      VSS, pt uncomfortable on exam. RLQ tenderness. No evidence of external hemorroids, anal fissure, or perianal abscess. Hx of pain before BMs, relieved by BM, and alternating constipation and diarrhea suggest IBS. However, given acute RLQ here, DDx includes appendicitis, ovarian pathology, diverticulitis, IBD.   CBC, CMP, UA, UPT unremarkable.  CT negative for appendicitis but does show cystic left  ovary with some free fluid.  Obtained ultrasound which shows left ovarian cysts without torsion.  This is the opposite side as the patient's tenderness on exam therefore I do not feel it is likely to be the source of today's symptoms.  Furthermore, the patient's description of several months of pain just before bowel movements is not suggestive of gynecologic pathology.  However, I did recommend that she follow-up with her OB/GYN.  Regarding her GI symptoms, recommended PCP follow-up for referral to GI if symptoms continue.  Discussed supportive measures and reviewed return precautions.  She voiced understanding. Final Clinical Impression(s) / ED Diagnoses Final diagnoses:  Abdominal pain, unspecified abdominal location  Left ovarian cyst    Rx / DC Orders ED Discharge Orders    None       Mantaj Chamberlin, Ambrose [place], MD 05/06/19 [phone]

## 2019-05-07 DIAGNOSIS — N926 Irregular menstruation, unspecified: Secondary | ICD-10-CM | POA: Diagnosis not present

## 2019-05-07 DIAGNOSIS — N83202 Unspecified ovarian cyst, left side: Secondary | ICD-10-CM | POA: Diagnosis not present

## 2019-05-07 LAB — URINE CULTURE

## 2019-05-07 LAB — GC/CHLAMYDIA PROBE AMP (~~LOC~~) NOT AT ARMC
Chlamydia: NEGATIVE
Comment: NEGATIVE
Comment: NORMAL
Neisseria Gonorrhea: NEGATIVE

## 2019-05-31 DIAGNOSIS — N979 Female infertility, unspecified: Secondary | ICD-10-CM | POA: Diagnosis not present

## 2019-05-31 DIAGNOSIS — N839 Noninflammatory disorder of ovary, fallopian tube and broad ligament, unspecified: Secondary | ICD-10-CM | POA: Diagnosis not present

## 2019-05-31 DIAGNOSIS — Z319 Encounter for procreative management, unspecified: Secondary | ICD-10-CM | POA: Diagnosis not present

## 2019-05-31 DIAGNOSIS — Z6836 Body mass index (BMI) 36.0-36.9, adult: Secondary | ICD-10-CM | POA: Diagnosis not present

## 2019-06-14 DIAGNOSIS — N83202 Unspecified ovarian cyst, left side: Secondary | ICD-10-CM | POA: Diagnosis not present

## 2019-06-14 DIAGNOSIS — N83291 Other ovarian cyst, right side: Secondary | ICD-10-CM | POA: Diagnosis not present

## 2019-07-07 DIAGNOSIS — N979 Female infertility, unspecified: Secondary | ICD-10-CM | POA: Diagnosis not present

## 2019-07-19 DIAGNOSIS — N979 Female infertility, unspecified: Secondary | ICD-10-CM | POA: Diagnosis not present

## 2019-08-24 DIAGNOSIS — N979 Female infertility, unspecified: Secondary | ICD-10-CM | POA: Diagnosis not present

## 2019-09-07 DIAGNOSIS — E119 Type 2 diabetes mellitus without complications: Secondary | ICD-10-CM | POA: Diagnosis not present

## 2019-09-07 DIAGNOSIS — I10 Essential (primary) hypertension: Secondary | ICD-10-CM | POA: Diagnosis not present

## 2019-09-07 DIAGNOSIS — E7800 Pure hypercholesterolemia, unspecified: Secondary | ICD-10-CM | POA: Diagnosis not present

## 2019-09-08 ENCOUNTER — Encounter: Payer: Self-pay | Admitting: Internal Medicine

## 2019-09-09 DIAGNOSIS — N979 Female infertility, unspecified: Secondary | ICD-10-CM | POA: Diagnosis not present

## 2019-10-12 DIAGNOSIS — N971 Female infertility of tubal origin: Secondary | ICD-10-CM | POA: Diagnosis not present

## 2019-10-15 DIAGNOSIS — E119 Type 2 diabetes mellitus without complications: Secondary | ICD-10-CM | POA: Diagnosis not present

## 2019-10-15 DIAGNOSIS — I10 Essential (primary) hypertension: Secondary | ICD-10-CM | POA: Diagnosis not present

## 2019-10-15 DIAGNOSIS — E7800 Pure hypercholesterolemia, unspecified: Secondary | ICD-10-CM | POA: Diagnosis not present

## 2019-10-18 IMAGING — CT CT HEAD W/O CM
3 series · 16 of 47 positions shown, 19 images · non-contrast
Comparison: 06/29/2010 brain MR and 11/14/2006 head CT

CLINICAL DATA: 31-year-old female with acute headache and RIGHT
visual disturbance for 2 hours.

EXAM:
CT HEAD WITHOUT CONTRAST
TECHNIQUE: Contiguous axial images were obtained from the base of the skull
through the vertex without intravenous contrast.

[Series 2: head wo · axial · 0.42mm/px · z∈[-110,+26]mm · 10 of 33 slices shown, 13 images]
[im 3/33  brain]
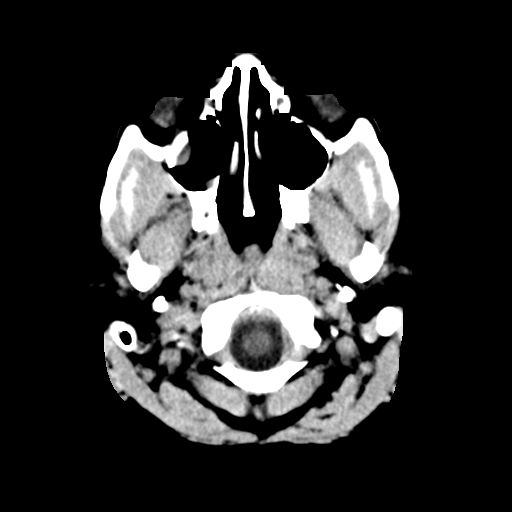
[im 3/33  bone]
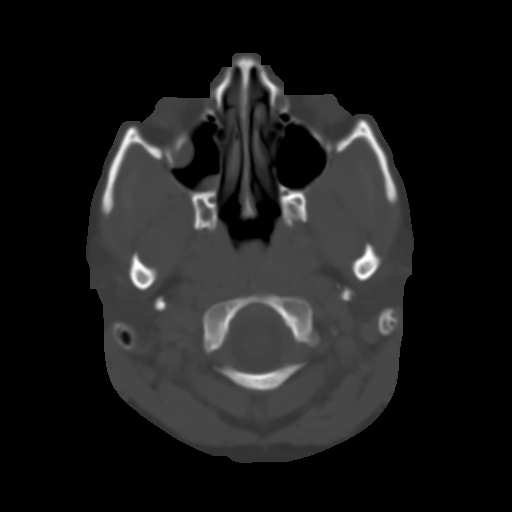
[im 6/33  brain]
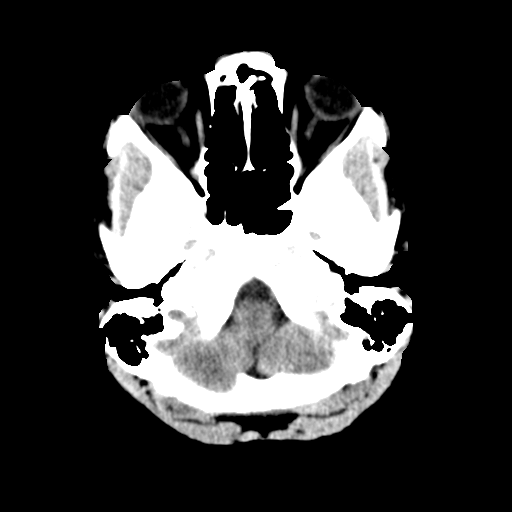
[im 9/33  brain]
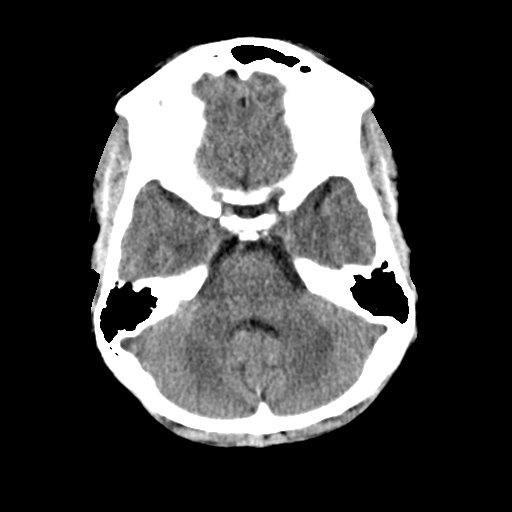
[im 12/33  brain]
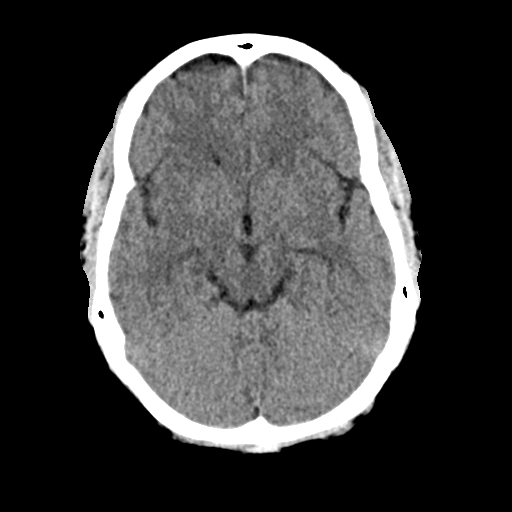
[im 15/33  brain]
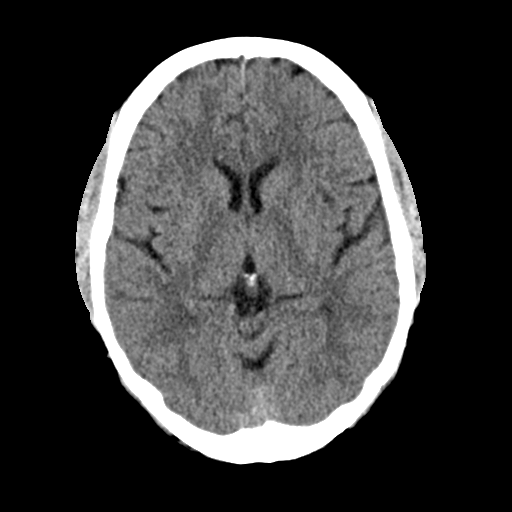
[im 15/33  bone]
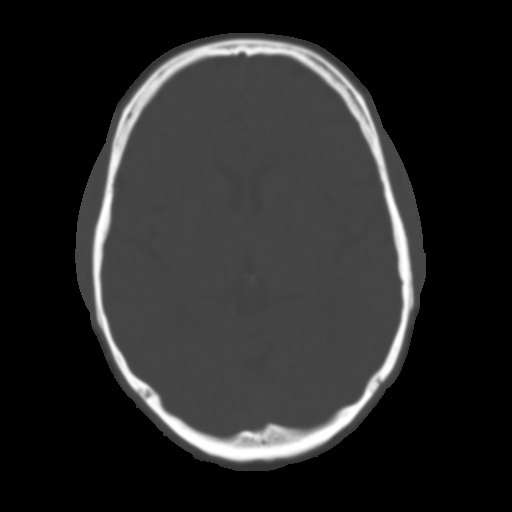
[im 18/33  brain]
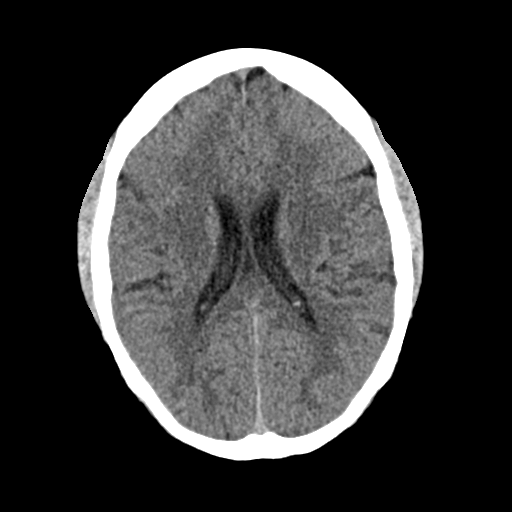
[im 21/33  brain]
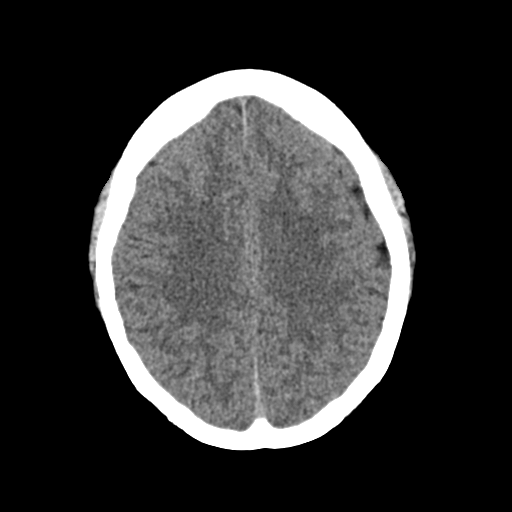
[im 25/33  brain]
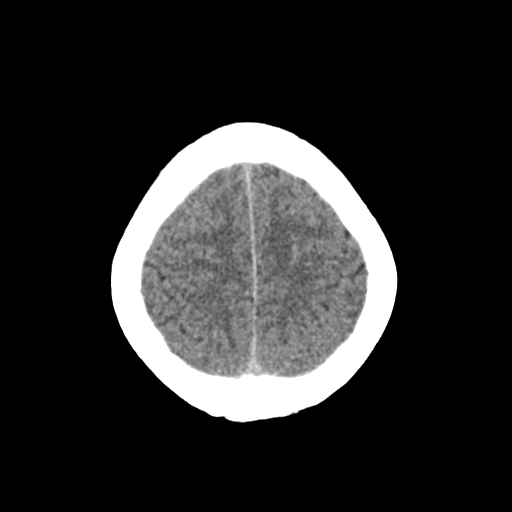
[im 27/33  brain]
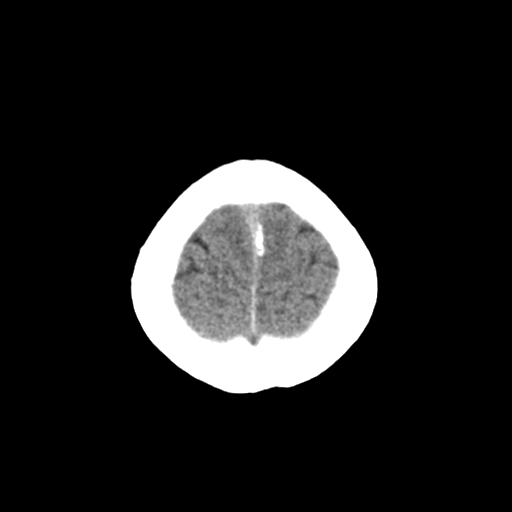
[im 27/33  bone]
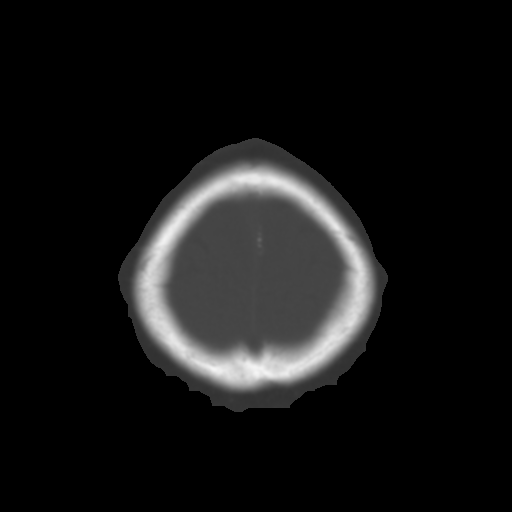
[im 30/33  brain]
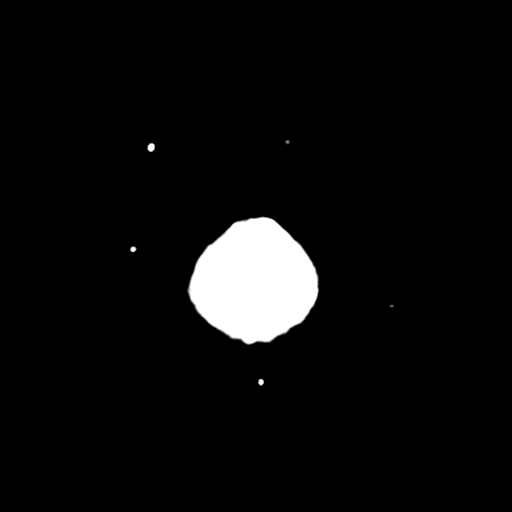

[Series 4: coronal soft · coronal · 0.32mm/px · 3 of 66 slices shown]
[im 22/66  brain]
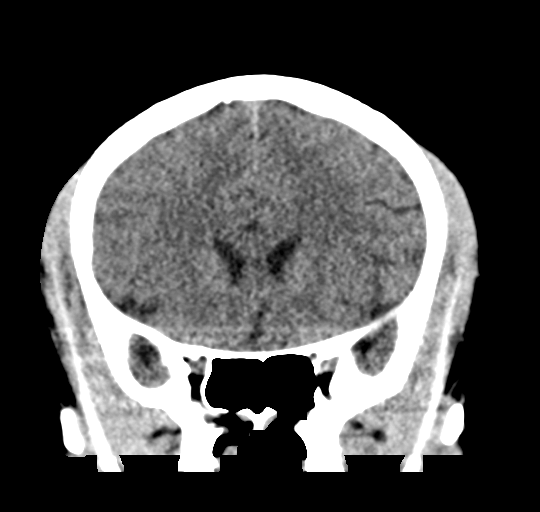
[im 29/66  brain]
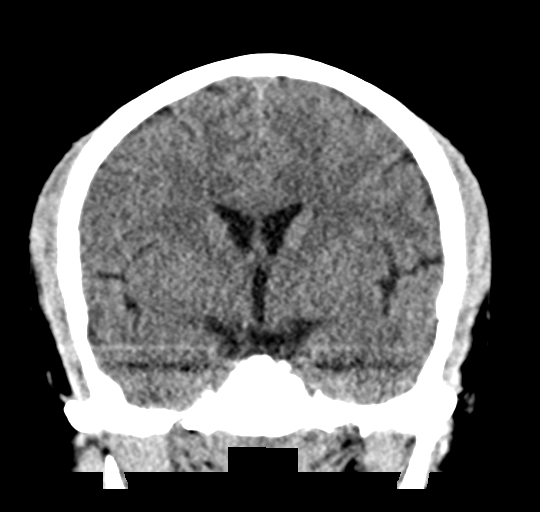
[im 37/66  brain]
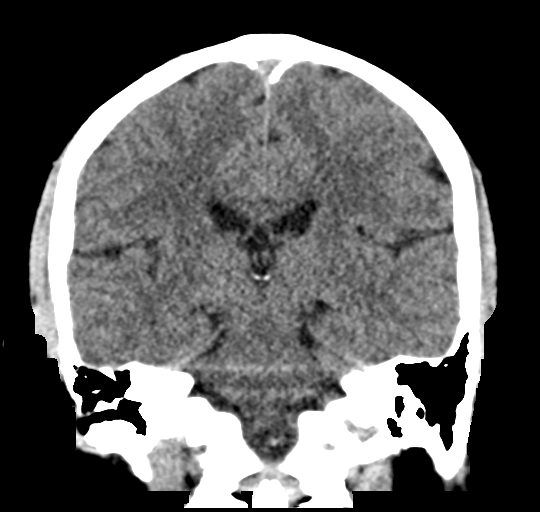

[Series 5: sag soft · sagittal · 0.33mm/px · 3 of 57 slices shown]
[im 19/57  brain]
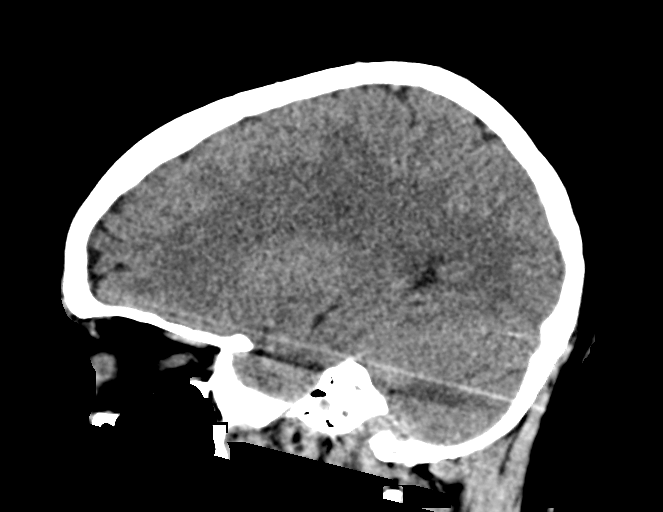
[im 29/57  brain]
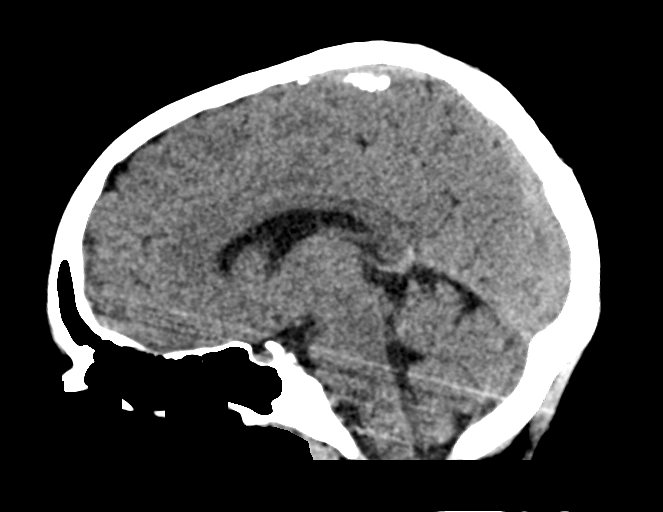
[im 38/57  brain]
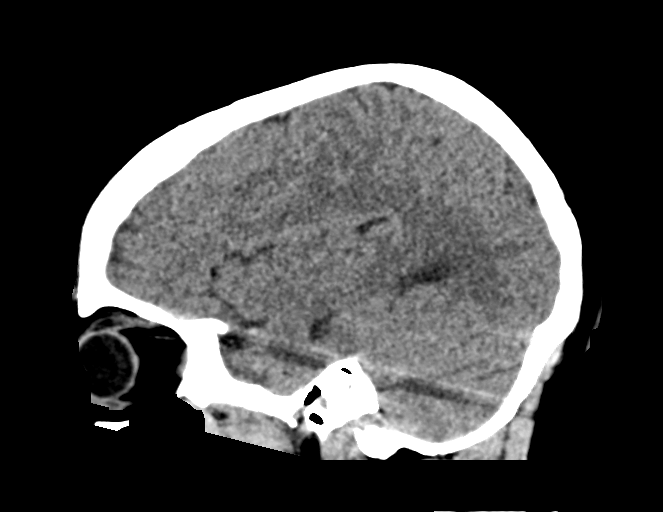

[16 of 47 positions shown; findings below may reference images not displayed]

FINDINGS: Brain: No evidence of acute infarction, hemorrhage, hydrocephalus,
extra-axial collection or mass lesion/mass effect.

Vascular: No hyperdense vessel or unexpected calcification.

Skull: Normal. Negative for fracture or focal lesion.

Sinuses/Orbits: No acute finding.

Other: None.
IMPRESSION: Unremarkable noncontrast head CT.

## 2019-10-25 ENCOUNTER — Other Ambulatory Visit: Payer: Self-pay

## 2019-10-25 ENCOUNTER — Emergency Department (INDEPENDENT_AMBULATORY_CARE_PROVIDER_SITE_OTHER)
Admission: EM | Admit: 2019-10-25 | Discharge: 2019-10-25 | Disposition: A | Discharge status: Home / Self Care | Payer: BC Managed Care – PPO | Source: Home / Self Care

## 2019-10-25 DIAGNOSIS — M549 Dorsalgia, unspecified: Secondary | ICD-10-CM

## 2019-10-25 DIAGNOSIS — M62838 Other muscle spasm: Secondary | ICD-10-CM

## 2019-10-25 DIAGNOSIS — M62830 Muscle spasm of back: Secondary | ICD-10-CM

## 2019-10-25 MED ORDER — METHOCARBAMOL 500 MG PO TABS: 500.0000 mg | ORAL_TABLET | Freq: Two times a day (BID) | ORAL | 0 refills | Status: DC

## 2019-10-25 MED ORDER — NAPROXEN SODIUM 220 MG PO TABS: 220.0000 mg | ORAL_TABLET | Freq: Two times a day (BID) | ORAL | 0 refills | Status: AC | PRN

## 2019-10-25 NOTE — ED Provider Notes (Signed)
[facility]    CSN: [id number] Arrival date & time: 10/25/19  0818      History   Chief Complaint Chief Complaint  Patient presents with  . Neck Pain  . Back Pain    HPI Samantha Gould is a 34 y.o. female.   HPI  Samantha Gould is a 34 y.o. female presenting to UC with c/o waking up this morning with Right side neck pain that radiates down Right side of back, stops midway. No known injury. Hx of back problems in the past.  Pain is stabbing, worse with movement. Denies radiation of pain or numbness in arms or legs.  She has not tried anything for pain PTA.  Pt works from home sitting at [employer].    Past Medical History:  Diagnosis Date  . History of gestational diabetes   . Hypertension    with pregnancy only  . Migraine    migraines; Exedrine Migraine works    Patient Active Problem List   Diagnosis Date Noted  . Intractable migraine with aura with status migrainosus 11/06/2017  . Chronic intractable headache 11/06/2017  . Hypertension   . History of gestational diabetes   . Hx of gestational diabetes mellitus, not currently pregnant 01/15/2017  . Postpartum care following vaginal delivery (7/26) 08/16/2015  . Preterm premature rupture of membranes 08/15/2015  . Indication for care in labor or delivery 08/15/2015  . PCP NOTES >>> 10/26/2014  . Annual physical exam 02/11/2011  . Migraine without aura 11/07/2006    Past Surgical History:  Procedure Laterality Date  . NO PAST SURGERIES      OB History    Gravida  1   Para  1   Term  0   Preterm  1   AB  0   Living  1     SAB  0   TAB  0   Ectopic  0   Multiple  0   Live Births  1            Home Medications    Prior to Admission medications   Medication Sig Start Date End Date Taking? Authorizing Provider  NIFEdipine (ADALAT CC) 30 MG 24 hr tablet Take 30 mg by mouth daily.   Yes [provider]  acetaminophen (TYLENOL) 500 MG tablet Take 500 mg by mouth as  needed.    [provider]  amLODipine (NORVASC) 2.5 MG tablet Take 1 tablet (2.5 mg total) by mouth daily. 08/24/18   Wanda Plump, MD  Aspirin-Acetaminophen-Caffeine (EXCEDRIN PO) Take by mouth as needed.    [provider]  azelastine (ASTELIN) 0.1 % nasal spray Place 2 sprays into both nostrils at bedtime as needed for rhinitis or allergies. Use in each nostril as directed 10/13/17   Wanda Plump, MD  EPINEPHrine 0.3 mg/0.3 mL IJ SOAJ injection Inject 0.3 mg into the muscle once as needed. 02/24/17   [provider]  HYDROcodone-homatropine (HYCODAN) 5-1.5 MG/5ML syrup Take 5 mLs by mouth at bedtime as needed for cough. 08/07/18   Wanda Plump, MD  levocetirizine (XYZAL) 5 MG tablet Take 1 tablet (5 mg total) by mouth every evening. 04/30/17   Wanda Plump, MD  levonorgestrel-ethinyl estradiol (ALESSE) 0.1-20 MG-MCG tablet Take 1 tablet by mouth daily. 06/23/18   Wanda Plump, MD  metFORMIN (GLUCOPHAGE) 500 MG tablet Take by mouth 2 (two) times daily with a meal.    [provider]  methocarbamol (ROBAXIN)  500 MG tablet Take 1 tablet (500 mg total) by mouth 2 (two) times daily. 10/25/19   Lurene Shadow, PA-C  naproxen sodium (ALEVE) 220 MG tablet Take 1-2 tablets (220-440 mg total) by mouth 2 (two) times daily as needed (pain). 10/25/19   Lurene Shadow, PA-C  SUMAtriptan (IMITREX) 50 MG tablet Take 1 tablet (50 mg total) by mouth every 2 (two) hours as needed for migraine. No more than 2 tabs in a 24 hour period Patient not taking: Reported on 11/05/2017 03/31/17   Wanda Plump, MD    Family History Family History  Problem Relation Age of Onset  . Breast cancer Maternal Grandmother   . Diabetes Maternal Grandmother   . Sleep apnea Father        nasal surgery  . Headache Father   . Sleep apnea Maternal Grandfather   . CAD Maternal Grandfather   . Diabetes Mother   . High blood pressure Mother   . Cancer Paternal Grandmother   . Migraines Sister   . Stroke Neg Hx     . Colon cancer Neg Hx     Social History Social History   Tobacco Use  . Smoking status: Former Smoker    Quit date: 08/24/2013    Years since quitting: 6.1  . Smokeless tobacco: Never Used  Vaping Use  . Vaping Use: Never used  Substance Use Topics  . Alcohol use: Yes    Alcohol/week: 0.0 standard drinks  . Drug use: No     Allergies   Patient has no known allergies.   Review of Systems Review of Systems  Constitutional: Negative for chills and fever.  Eyes: Negative for photophobia and visual disturbance.  Musculoskeletal: Positive for back pain, myalgias, neck pain and neck stiffness.  Skin: Negative for color change, rash and wound.  Neurological: Negative for dizziness, weakness, numbness and headaches.     Physical Exam Triage Vital Signs ED Triage Vitals  Enc Vitals Group     BP 10/25/19 0836 (!) 137/92     Pulse Rate 10/25/19 0836 85     Resp 10/25/19 0836 16     Temp 10/25/19 0836 98.7 F (37.1 C)     Temp Source 10/25/19 0836 Oral     SpO2 10/25/19 0836 99 %     Weight --      Height --      Head Circumference --      Peak Flow --      Pain Score 10/25/19 0833 1     Pain Loc --      Pain Edu? --      Excl. in GC? --    No data found.  Updated Vital Signs BP (!) 137/92 (BP Location: Right Arm)   Pulse 85   Temp 98.7 F (37.1 C) (Oral)   Resp 16   SpO2 99%   Visual Acuity Right Eye Distance:   Left Eye Distance:   Bilateral Distance:    Right Eye Near:   Left Eye Near:    Bilateral Near:     Physical Exam Vitals and nursing note reviewed.  Constitutional:      General: She is not in acute distress.    Appearance: Normal appearance. She is well-developed. She is not ill-appearing, toxic-appearing or diaphoretic.  HENT:     Head: Normocephalic and atraumatic.  Cardiovascular:     Rate and Rhythm: Normal rate and regular rhythm.     Pulses:  Radial pulses are 2+ on the right side and 2+ on the left side.  Pulmonary:      Effort: Pulmonary effort is normal. No respiratory distress.  Musculoskeletal:        General: Normal range of motion.     Cervical back: Neck supple. Pain with movement (with rotation to the Right and flexion) and muscular tenderness present. No spinous process tenderness.     Comments: No spinal tenderness. Tenderness to Right side cervical and upper to mid thoracic muscles.  Full ROM upper and lower extremities bilaterally.   Skin:    General: Skin is warm and dry.     Capillary Refill: Capillary refill takes less than 2 seconds.  Neurological:     Mental Status: She is alert and oriented to person, place, and time.     Sensory: No sensory deficit.  Psychiatric:        Behavior: Behavior normal.      UC Treatments / Results  Labs (all labs ordered are listed, but only abnormal results are displayed) Labs Reviewed - No data to display  EKG   Radiology No results found.  Procedures Procedures (including critical care time)  Medications Ordered in UC Medications - No data to display  Initial Impression / Assessment and Plan / UC Course  I have reviewed the triage vital signs and the nursing notes.  Pertinent labs & imaging results that were available during my care of the patient were reviewed by me and considered in my medical decision making (see chart for details).     Hx and exam c/w muscle spasms No indication for imaging at this time Encouraged symptomatic tx F/u with PCP or Sports Medicine as needed  Final Clinical Impressions(s) / UC Diagnoses   Final diagnoses:  Muscle spasms of neck  Upper back pain on right side  Spasm of thoracic back muscle     Discharge Instructions      You may take 500mg  acetaminophen every 4-6 hours or in combination with the prescribed naproxen and robaxin for pain and inflammation. Alternate cool and warm compresses and try gentle stretching to help with pain.   Robaxin (methocarbamol) is a muscle relaxer and may cause  drowsiness. Do not drink alcohol, drive, or operate heavy machinery while taking.  Call to schedule a follow up appointment with Sports Medicine or primary care later this week as needed.    ED Prescriptions    Medication Sig Dispense Auth. Provider   methocarbamol (ROBAXIN) 500 MG tablet Take 1 tablet (500 mg total) by mouth 2 (two) times daily. 20 tablet O, PA-C   naproxen sodium (ALEVE) 220 MG tablet Take 1-2 tablets (220-440 mg total) by mouth 2 (two) times daily as needed (pain). 30 tablet Waylan Rocher, Lurene Shadow     PDMP not reviewed this encounter.   [place], Lurene Shadow 10/25/19 [id number]

## 2019-10-25 NOTE — Discharge Instructions (Signed)
  You may take 500mg  acetaminophen every 4-6 hours or in combination with the prescribed naproxen and robaxin for pain and inflammation. Alternate cool and warm compresses and try gentle stretching to help with pain.   Robaxin (methocarbamol) is a muscle relaxer and may cause drowsiness. Do not drink alcohol, drive, or operate heavy machinery while taking.  Call to schedule a follow up appointment with Sports Medicine or primary care later this week as needed.

## 2019-10-25 NOTE — ED Triage Notes (Signed)
Patient presents to Urgent Care with complaints of neck pain that radiates down her back since early this morning when she rolled over in bed. Patient reports it is a stabbing pain, worse with movement.

## 2019-12-14 DIAGNOSIS — O09291 Supervision of pregnancy with other poor reproductive or obstetric history, first trimester: Secondary | ICD-10-CM | POA: Diagnosis not present

## 2019-12-14 DIAGNOSIS — Z3481 Encounter for supervision of other normal pregnancy, first trimester: Secondary | ICD-10-CM | POA: Diagnosis not present

## 2019-12-14 DIAGNOSIS — Z114 Encounter for screening for human immunodeficiency virus [HIV]: Secondary | ICD-10-CM | POA: Diagnosis not present

## 2019-12-14 DIAGNOSIS — O99211 Obesity complicating pregnancy, first trimester: Secondary | ICD-10-CM | POA: Diagnosis not present

## 2019-12-14 DIAGNOSIS — O10111 Pre-existing hypertensive heart disease complicating pregnancy, first trimester: Secondary | ICD-10-CM | POA: Diagnosis not present

## 2019-12-14 DIAGNOSIS — O09521 Supervision of elderly multigravida, first trimester: Secondary | ICD-10-CM | POA: Diagnosis not present

## 2019-12-14 DIAGNOSIS — I10 Essential (primary) hypertension: Secondary | ICD-10-CM | POA: Diagnosis not present

## 2019-12-14 DIAGNOSIS — Z8632 Personal history of gestational diabetes: Secondary | ICD-10-CM | POA: Diagnosis not present

## 2019-12-14 LAB — BASIC METABOLIC PANEL
BUN: 10 (ref 4–21)
CO2: 17 (ref 13–22)
Chloride: 101 (ref 99–108)
Creatinine: 0.6 (ref 0.5–1.1)
Glucose: 115
Potassium: 3.8 (ref 3.4–5.3)
Sodium: 135 — AB (ref 137–147)

## 2019-12-14 LAB — COMPREHENSIVE METABOLIC PANEL
Albumin: 3.9 (ref 3.5–5.0)
Calcium: 9.5 (ref 8.7–10.7)
GFR calc Af Amer: 134
GFR calc non Af Amer: 116
Globulin: 2.9

## 2019-12-14 LAB — CBC: RBC: 4.55 (ref 3.87–5.11)

## 2019-12-14 LAB — HEPATIC FUNCTION PANEL
ALT: 16 (ref 7–35)
AST: 17 (ref 13–35)
Alkaline Phosphatase: 71 (ref 25–125)
Bilirubin, Total: 0.6

## 2019-12-14 LAB — CBC AND DIFFERENTIAL
HCT: 41 (ref 36–46)
Hemoglobin: 14 (ref 12.0–16.0)
Neutrophils Absolute: 5.9
Platelets: 330 (ref 150–399)
WBC: 8.3

## 2019-12-14 LAB — HEMOGLOBIN A1C: Hemoglobin A1C: 6

## 2019-12-14 LAB — HM HEPATITIS C SCREENING LAB: HM Hepatitis Screen: NEGATIVE

## 2019-12-14 LAB — HM HIV SCREENING LAB: HM HIV Screening: NEGATIVE

## 2019-12-14 LAB — TSH: TSH: 1.14 (ref 0.41–5.90)

## 2020-01-05 DIAGNOSIS — O09521 Supervision of elderly multigravida, first trimester: Secondary | ICD-10-CM | POA: Diagnosis not present

## 2020-01-10 ENCOUNTER — Encounter: Payer: Self-pay | Admitting: Internal Medicine

## 2020-01-20 DIAGNOSIS — O10111 Pre-existing hypertensive heart disease complicating pregnancy, first trimester: Secondary | ICD-10-CM | POA: Diagnosis not present

## 2020-01-20 DIAGNOSIS — O09521 Supervision of elderly multigravida, first trimester: Secondary | ICD-10-CM | POA: Diagnosis not present

## 2020-01-20 DIAGNOSIS — Z113 Encounter for screening for infections with a predominantly sexual mode of transmission: Secondary | ICD-10-CM | POA: Diagnosis not present

## 2020-01-20 DIAGNOSIS — O99211 Obesity complicating pregnancy, first trimester: Secondary | ICD-10-CM | POA: Diagnosis not present

## 2020-02-17 DIAGNOSIS — Z363 Encounter for antenatal screening for malformations: Secondary | ICD-10-CM | POA: Diagnosis not present

## 2020-02-17 DIAGNOSIS — Z3482 Encounter for supervision of other normal pregnancy, second trimester: Secondary | ICD-10-CM | POA: Diagnosis not present

## 2020-03-09 DIAGNOSIS — Z363 Encounter for antenatal screening for malformations: Secondary | ICD-10-CM | POA: Diagnosis not present

## 2020-03-09 DIAGNOSIS — O09522 Supervision of elderly multigravida, second trimester: Secondary | ICD-10-CM | POA: Diagnosis not present

## 2020-03-09 DIAGNOSIS — O99212 Obesity complicating pregnancy, second trimester: Secondary | ICD-10-CM | POA: Diagnosis not present

## 2020-04-06 ENCOUNTER — Encounter: Payer: Self-pay | Admitting: Internal Medicine

## 2020-04-17 DIAGNOSIS — O368121 Decreased fetal movements, second trimester, fetus 1: Secondary | ICD-10-CM | POA: Diagnosis not present

## 2020-04-17 DIAGNOSIS — O09212 Supervision of pregnancy with history of pre-term labor, second trimester: Secondary | ICD-10-CM | POA: Diagnosis not present

## 2020-04-17 DIAGNOSIS — O24912 Unspecified diabetes mellitus in pregnancy, second trimester: Secondary | ICD-10-CM | POA: Diagnosis not present

## 2020-04-17 DIAGNOSIS — O99212 Obesity complicating pregnancy, second trimester: Secondary | ICD-10-CM | POA: Diagnosis not present

## 2020-04-25 DIAGNOSIS — O24419 Gestational diabetes mellitus in pregnancy, unspecified control: Secondary | ICD-10-CM | POA: Diagnosis not present

## 2020-05-01 DIAGNOSIS — Z23 Encounter for immunization: Secondary | ICD-10-CM | POA: Diagnosis not present

## 2020-05-01 DIAGNOSIS — Z3402 Encounter for supervision of normal first pregnancy, second trimester: Secondary | ICD-10-CM | POA: Diagnosis not present

## 2020-05-09 DIAGNOSIS — O10013 Pre-existing essential hypertension complicating pregnancy, third trimester: Secondary | ICD-10-CM | POA: Diagnosis not present

## 2020-05-09 DIAGNOSIS — Z3A28 28 weeks gestation of pregnancy: Secondary | ICD-10-CM | POA: Diagnosis not present

## 2020-05-09 DIAGNOSIS — O368130 Decreased fetal movements, third trimester, not applicable or unspecified: Secondary | ICD-10-CM | POA: Diagnosis not present

## 2020-05-09 DIAGNOSIS — O24419 Gestational diabetes mellitus in pregnancy, unspecified control: Secondary | ICD-10-CM | POA: Diagnosis not present

## 2020-05-09 DIAGNOSIS — Z79899 Other long term (current) drug therapy: Secondary | ICD-10-CM | POA: Diagnosis not present

## 2020-06-05 DIAGNOSIS — O24913 Unspecified diabetes mellitus in pregnancy, third trimester: Secondary | ICD-10-CM | POA: Diagnosis not present

## 2020-06-05 DIAGNOSIS — O09213 Supervision of pregnancy with history of pre-term labor, third trimester: Secondary | ICD-10-CM | POA: Diagnosis not present

## 2020-06-05 DIAGNOSIS — O10113 Pre-existing hypertensive heart disease complicating pregnancy, third trimester: Secondary | ICD-10-CM | POA: Diagnosis not present

## 2020-06-12 DIAGNOSIS — O09523 Supervision of elderly multigravida, third trimester: Secondary | ICD-10-CM | POA: Diagnosis not present

## 2020-06-12 DIAGNOSIS — O24913 Unspecified diabetes mellitus in pregnancy, third trimester: Secondary | ICD-10-CM | POA: Diagnosis not present

## 2020-06-12 DIAGNOSIS — O09293 Supervision of pregnancy with other poor reproductive or obstetric history, third trimester: Secondary | ICD-10-CM | POA: Diagnosis not present

## 2020-06-12 DIAGNOSIS — O98513 Other viral diseases complicating pregnancy, third trimester: Secondary | ICD-10-CM | POA: Diagnosis not present

## 2020-06-15 DIAGNOSIS — O09293 Supervision of pregnancy with other poor reproductive or obstetric history, third trimester: Secondary | ICD-10-CM | POA: Diagnosis not present

## 2020-06-15 DIAGNOSIS — O09523 Supervision of elderly multigravida, third trimester: Secondary | ICD-10-CM | POA: Diagnosis not present

## 2020-06-15 DIAGNOSIS — O24913 Unspecified diabetes mellitus in pregnancy, third trimester: Secondary | ICD-10-CM | POA: Diagnosis not present

## 2020-06-15 DIAGNOSIS — O10113 Pre-existing hypertensive heart disease complicating pregnancy, third trimester: Secondary | ICD-10-CM | POA: Diagnosis not present

## 2020-06-20 DIAGNOSIS — O09523 Supervision of elderly multigravida, third trimester: Secondary | ICD-10-CM | POA: Diagnosis not present

## 2020-06-20 DIAGNOSIS — O10113 Pre-existing hypertensive heart disease complicating pregnancy, third trimester: Secondary | ICD-10-CM | POA: Diagnosis not present

## 2020-06-20 DIAGNOSIS — O24913 Unspecified diabetes mellitus in pregnancy, third trimester: Secondary | ICD-10-CM | POA: Diagnosis not present

## 2020-06-23 DIAGNOSIS — O10113 Pre-existing hypertensive heart disease complicating pregnancy, third trimester: Secondary | ICD-10-CM | POA: Diagnosis not present

## 2020-06-23 DIAGNOSIS — O99213 Obesity complicating pregnancy, third trimester: Secondary | ICD-10-CM | POA: Diagnosis not present

## 2020-06-23 DIAGNOSIS — O24913 Unspecified diabetes mellitus in pregnancy, third trimester: Secondary | ICD-10-CM | POA: Diagnosis not present

## 2020-06-26 DIAGNOSIS — O99213 Obesity complicating pregnancy, third trimester: Secondary | ICD-10-CM | POA: Diagnosis not present

## 2020-06-26 DIAGNOSIS — O24913 Unspecified diabetes mellitus in pregnancy, third trimester: Secondary | ICD-10-CM | POA: Diagnosis not present

## 2020-06-26 DIAGNOSIS — O113 Pre-existing hypertension with pre-eclampsia, third trimester: Secondary | ICD-10-CM | POA: Diagnosis not present

## 2020-06-26 DIAGNOSIS — O09523 Supervision of elderly multigravida, third trimester: Secondary | ICD-10-CM | POA: Diagnosis not present

## 2020-06-29 DIAGNOSIS — O10113 Pre-existing hypertensive heart disease complicating pregnancy, third trimester: Secondary | ICD-10-CM | POA: Diagnosis not present

## 2020-06-29 DIAGNOSIS — O09523 Supervision of elderly multigravida, third trimester: Secondary | ICD-10-CM | POA: Diagnosis not present

## 2020-06-29 DIAGNOSIS — O24913 Unspecified diabetes mellitus in pregnancy, third trimester: Secondary | ICD-10-CM | POA: Diagnosis not present

## 2020-06-29 DIAGNOSIS — O99213 Obesity complicating pregnancy, third trimester: Secondary | ICD-10-CM | POA: Diagnosis not present

## 2020-07-03 DIAGNOSIS — O24913 Unspecified diabetes mellitus in pregnancy, third trimester: Secondary | ICD-10-CM | POA: Diagnosis not present

## 2020-07-03 DIAGNOSIS — Z3483 Encounter for supervision of other normal pregnancy, third trimester: Secondary | ICD-10-CM | POA: Diagnosis not present

## 2020-07-03 DIAGNOSIS — O09523 Supervision of elderly multigravida, third trimester: Secondary | ICD-10-CM | POA: Diagnosis not present

## 2020-07-03 DIAGNOSIS — O3663X1 Maternal care for excessive fetal growth, third trimester, fetus 1: Secondary | ICD-10-CM | POA: Diagnosis not present

## 2020-07-03 DIAGNOSIS — O99213 Obesity complicating pregnancy, third trimester: Secondary | ICD-10-CM | POA: Diagnosis not present

## 2020-07-03 DIAGNOSIS — Z3685 Encounter for antenatal screening for Streptococcus B: Secondary | ICD-10-CM | POA: Diagnosis not present

## 2020-07-03 DIAGNOSIS — Z113 Encounter for screening for infections with a predominantly sexual mode of transmission: Secondary | ICD-10-CM | POA: Diagnosis not present

## 2020-07-07 DIAGNOSIS — O10113 Pre-existing hypertensive heart disease complicating pregnancy, third trimester: Secondary | ICD-10-CM | POA: Diagnosis not present

## 2020-07-07 DIAGNOSIS — O09213 Supervision of pregnancy with history of pre-term labor, third trimester: Secondary | ICD-10-CM | POA: Diagnosis not present

## 2020-07-07 DIAGNOSIS — O09523 Supervision of elderly multigravida, third trimester: Secondary | ICD-10-CM | POA: Diagnosis not present

## 2020-07-07 DIAGNOSIS — O24913 Unspecified diabetes mellitus in pregnancy, third trimester: Secondary | ICD-10-CM | POA: Diagnosis not present

## 2020-07-08 DIAGNOSIS — O113 Pre-existing hypertension with pre-eclampsia, third trimester: Secondary | ICD-10-CM | POA: Diagnosis not present

## 2020-07-08 DIAGNOSIS — O24410 Gestational diabetes mellitus in pregnancy, diet controlled: Secondary | ICD-10-CM | POA: Diagnosis not present

## 2020-07-08 DIAGNOSIS — O24424 Gestational diabetes mellitus in childbirth, insulin controlled: Secondary | ICD-10-CM | POA: Diagnosis not present

## 2020-07-08 DIAGNOSIS — O99824 Streptococcus B carrier state complicating childbirth: Secondary | ICD-10-CM | POA: Diagnosis not present

## 2020-07-08 DIAGNOSIS — O164 Unspecified maternal hypertension, complicating childbirth: Secondary | ICD-10-CM | POA: Diagnosis not present

## 2020-07-08 DIAGNOSIS — R1084 Generalized abdominal pain: Secondary | ICD-10-CM | POA: Diagnosis not present

## 2020-07-08 DIAGNOSIS — O26893 Other specified pregnancy related conditions, third trimester: Secondary | ICD-10-CM | POA: Diagnosis not present

## 2020-07-08 DIAGNOSIS — Z79899 Other long term (current) drug therapy: Secondary | ICD-10-CM | POA: Diagnosis not present

## 2020-07-08 DIAGNOSIS — Z794 Long term (current) use of insulin: Secondary | ICD-10-CM | POA: Diagnosis not present

## 2020-07-08 DIAGNOSIS — O4202 Full-term premature rupture of membranes, onset of labor within 24 hours of rupture: Secondary | ICD-10-CM | POA: Diagnosis not present

## 2020-07-08 DIAGNOSIS — O4292 Full-term premature rupture of membranes, unspecified as to length of time between rupture and onset of labor: Secondary | ICD-10-CM | POA: Diagnosis not present

## 2020-07-08 DIAGNOSIS — Z3A37 37 weeks gestation of pregnancy: Secondary | ICD-10-CM | POA: Diagnosis not present

## 2020-07-21 DIAGNOSIS — R309 Painful micturition, unspecified: Secondary | ICD-10-CM | POA: Diagnosis not present

## 2020-08-07 DIAGNOSIS — E119 Type 2 diabetes mellitus without complications: Secondary | ICD-10-CM | POA: Diagnosis not present

## 2020-08-18 DIAGNOSIS — Z3042 Encounter for surveillance of injectable contraceptive: Secondary | ICD-10-CM | POA: Diagnosis not present

## 2020-08-18 DIAGNOSIS — Z6836 Body mass index (BMI) 36.0-36.9, adult: Secondary | ICD-10-CM | POA: Diagnosis not present

## 2020-09-13 IMAGING — CR CHEST - 2 VIEW
2 series · 2 of 2 positions shown · non-contrast
Comparison: 08/12/2014

CLINICAL DATA: Cough 3 weeks with some shortness-of-breath.

EXAM:
CHEST - 2 VIEW

[w chest pa]
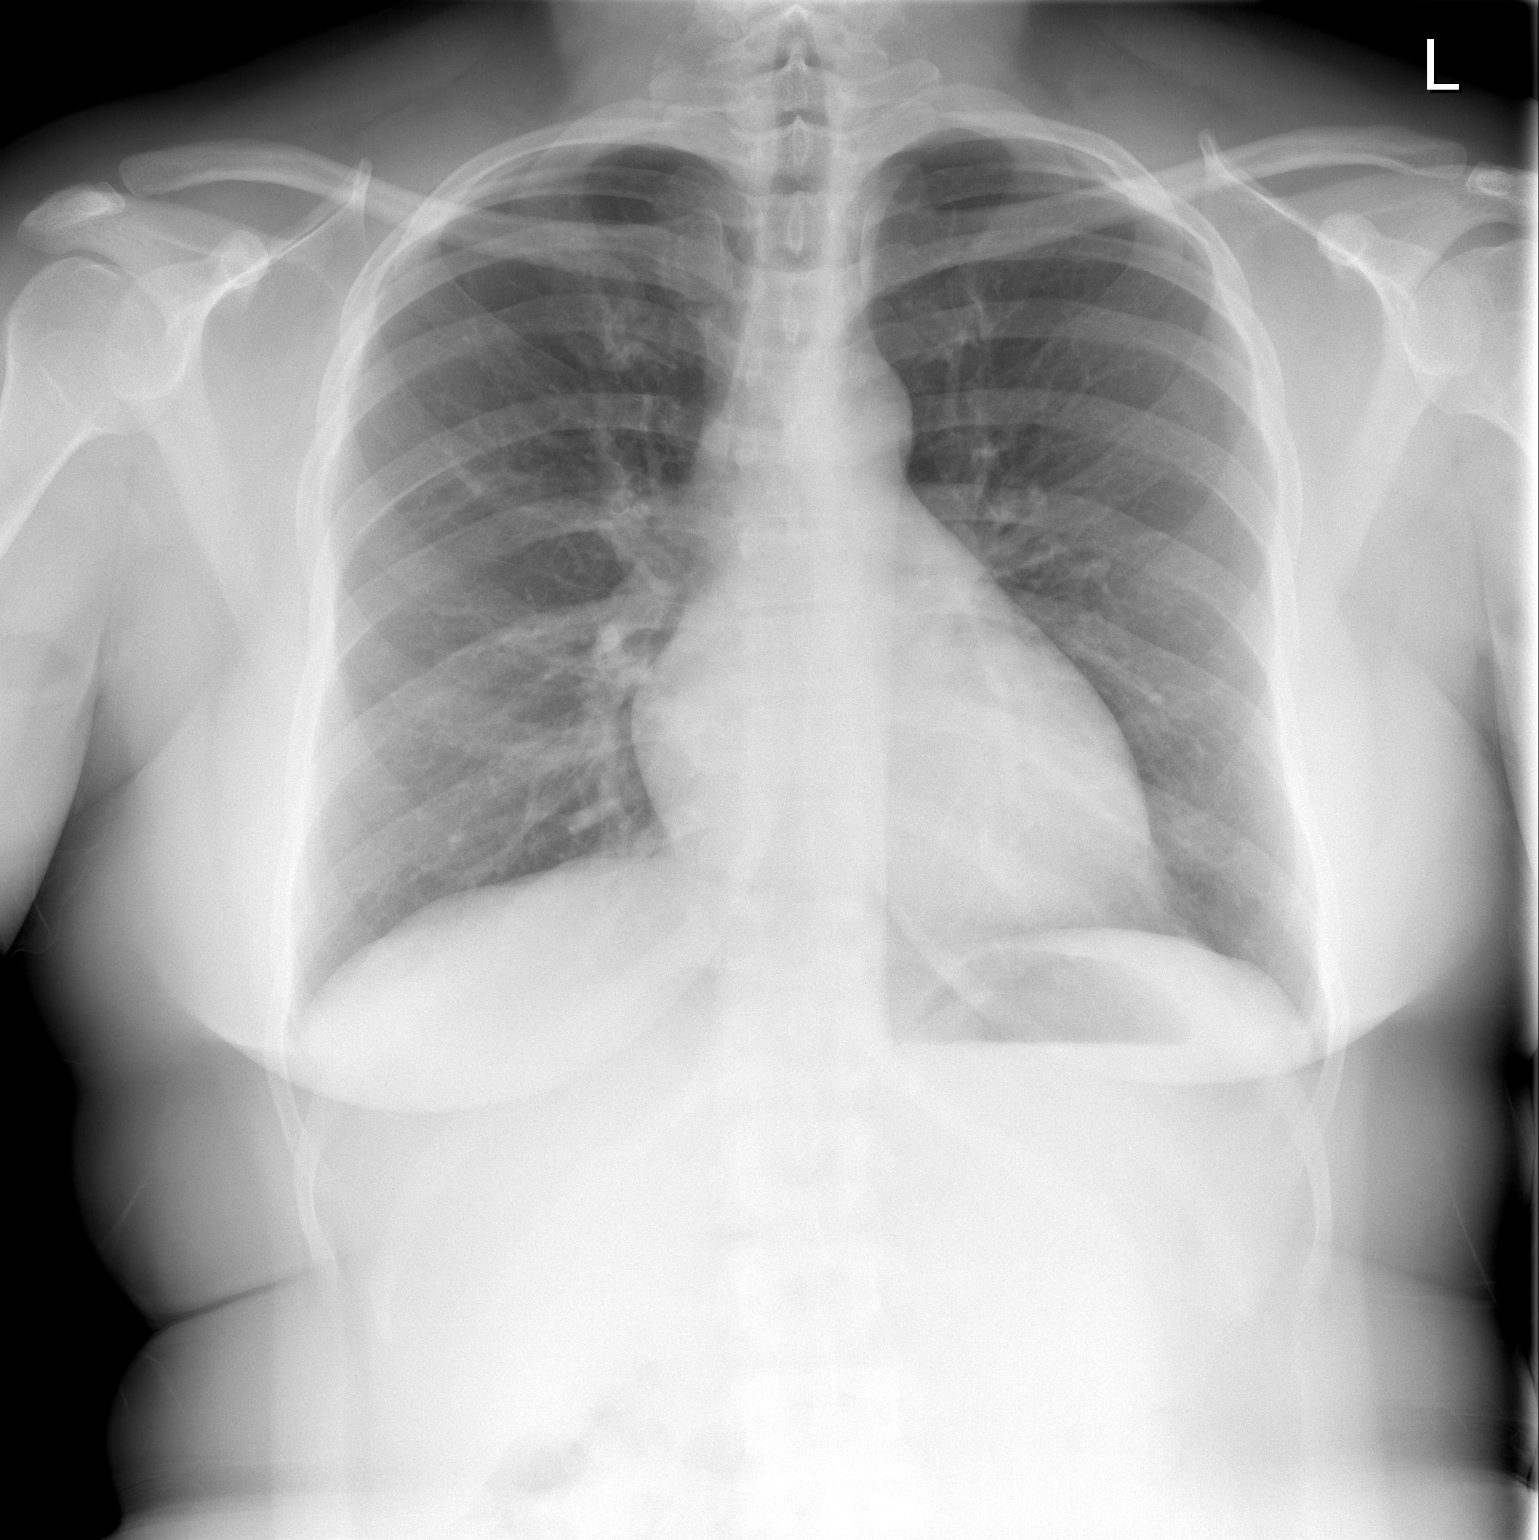

[w chest lat]
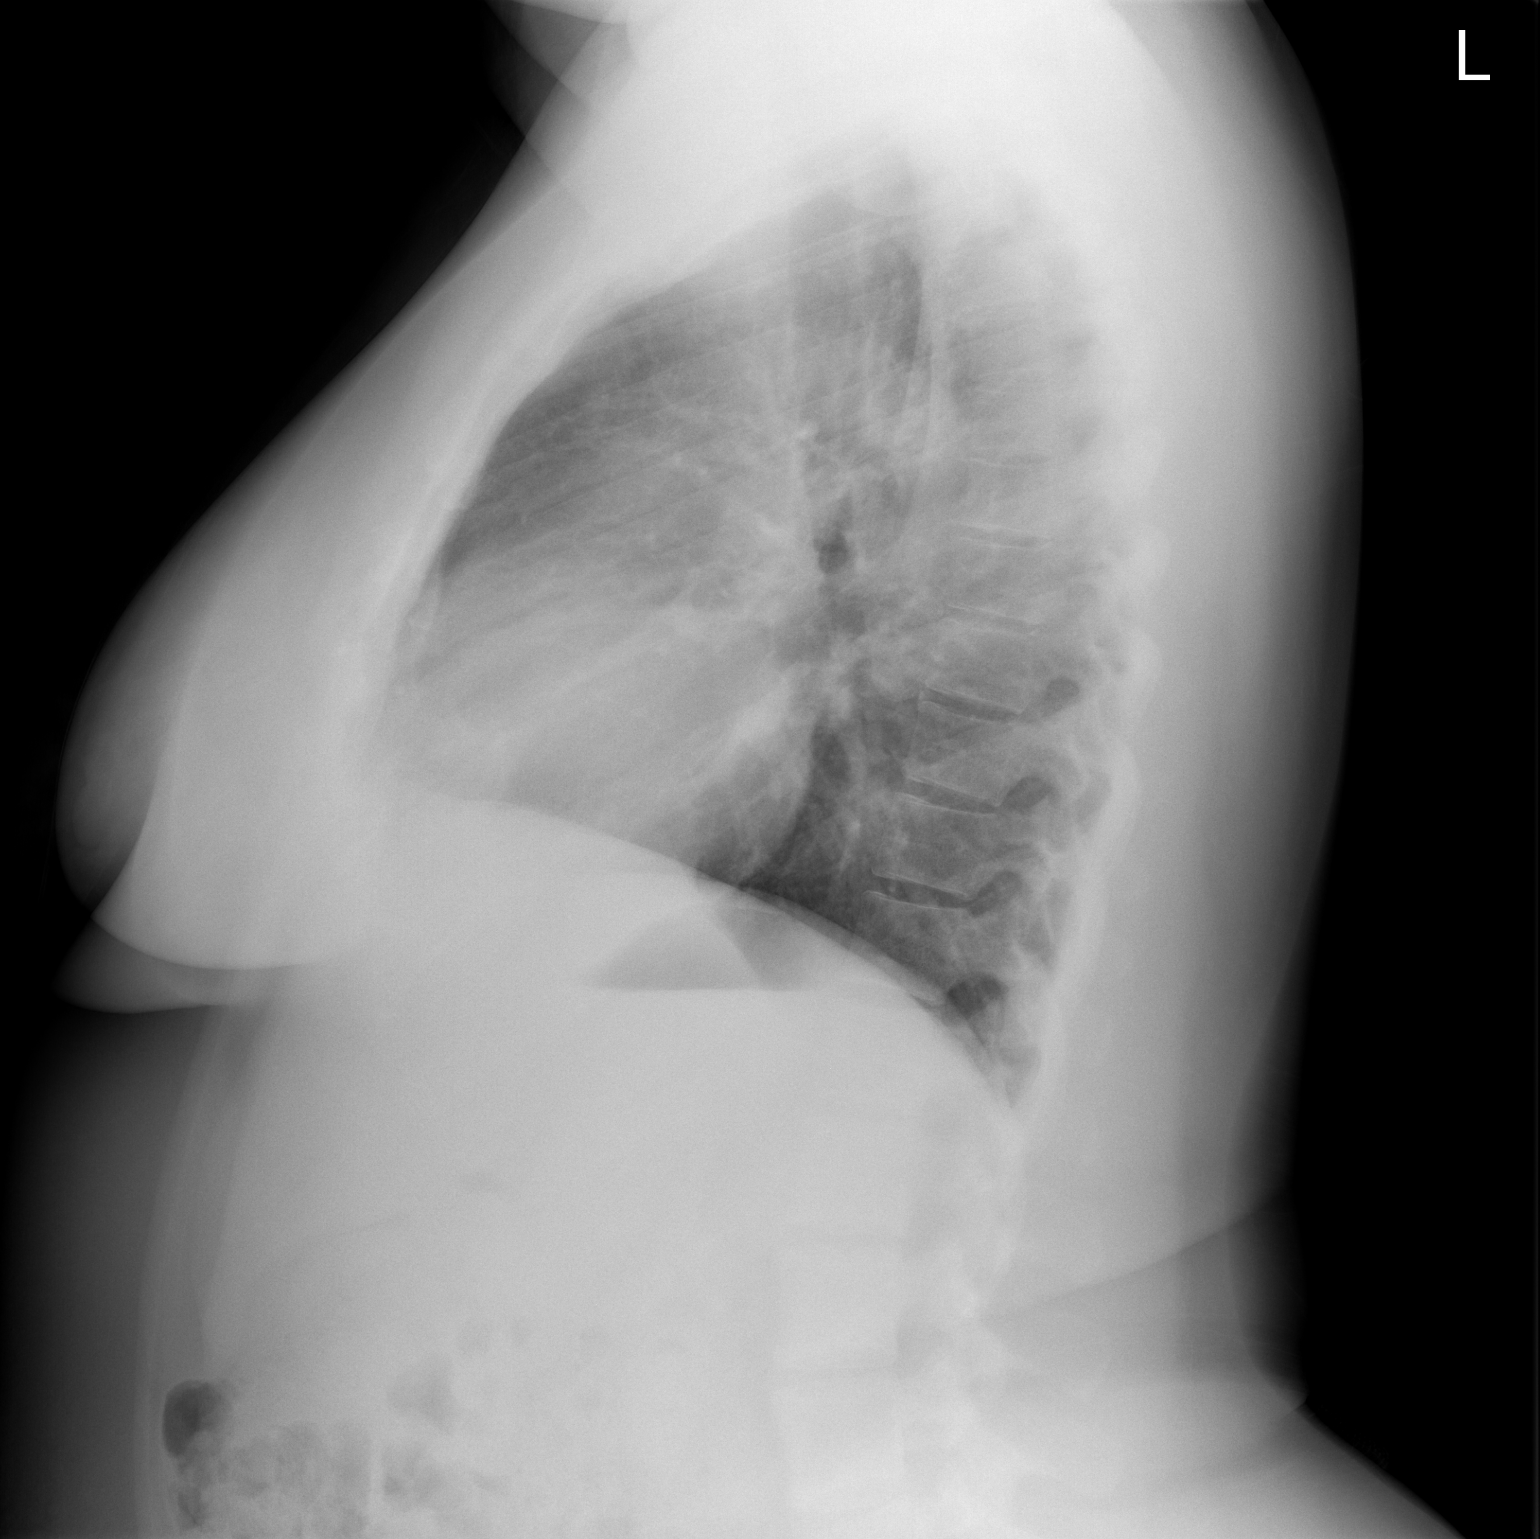

[2 of 2 positions shown; findings below may reference images not displayed]

FINDINGS: The heart size and mediastinal contours are within normal limits.
Both lungs are clear. The visualized skeletal structures are
unremarkable.
IMPRESSION: No active cardiopulmonary disease.

## 2020-09-27 ENCOUNTER — Encounter: Payer: BC Managed Care – PPO | Admitting: Internal Medicine

## 2020-09-27 ENCOUNTER — Encounter: Payer: Self-pay | Admitting: Internal Medicine

## 2020-10-04 ENCOUNTER — Encounter: Payer: Self-pay | Admitting: Internal Medicine

## 2020-10-04 ENCOUNTER — Ambulatory Visit (INDEPENDENT_AMBULATORY_CARE_PROVIDER_SITE_OTHER): Payer: BC Managed Care – PPO | Admitting: Internal Medicine

## 2020-10-04 ENCOUNTER — Other Ambulatory Visit: Payer: Self-pay

## 2020-10-04 VITALS — BP 122/80 | HR 84 | Temp 98.0°F | Resp 16 | Ht 66.0 in | Wt 220.2 lb

## 2020-10-04 DIAGNOSIS — I10 Essential (primary) hypertension: Secondary | ICD-10-CM | POA: Diagnosis not present

## 2020-10-04 DIAGNOSIS — Z8632 Personal history of gestational diabetes: Secondary | ICD-10-CM

## 2020-10-04 DIAGNOSIS — Z0000 Encounter for general adult medical examination without abnormal findings: Secondary | ICD-10-CM | POA: Diagnosis not present

## 2020-10-04 LAB — MICROALBUMIN / CREATININE URINE RATIO
Creatinine,U: 147.9 mg/dL
Microalb Creat Ratio: 0.8 mg/g (ref 0.0–30.0)
Microalb, Ur: 1.2 mg/dL (ref 0.0–1.9)

## 2020-10-04 NOTE — Patient Instructions (Addendum)
Recommend to proceed with the following vaccines at your pharmacy:  COVID #3  Flu shot this fall  Check the  blood pressure 2 or 3 times a month   BP GOAL is between 110/65 and  135/85. If it is consistently higher or lower, let me know   Diabetes: Check your blood sugar twice a week until they are consistently normal. check your blood sugar  at different times of the day  GOALS: Fasting before a meal 70- 130 2 hours after a meal less than 180    GO TO THE LAB : Get the blood work     GO TO THE FRONT DESK, PLEASE SCHEDULE YOUR APPOINTMENTS Come back for   a checkup in 6 months

## 2020-10-04 NOTE — Progress Notes (Signed)
Subjective:    Patient ID: Samantha Gould, female    DOB: [birth year], 35 y.o.   MRN: [id number]  DOS:  10/04/2020 Type of visit - description: CPX  Here for CPX, postpartum for few months,  currently breast-feeding. In general feels well. I ask about depression and she denies it, she is somewhat stressed due to the lack of sleep.  Also, multiple years history of left-sided chest pain.  Always located in the same place on the left upper chest, lasts seconds, no associated with nausea/palpitations/syncope.  Typically at rest.  No associated with food, no GERD symptoms, no radiation.   Review of Systems  Other than above, a 14 point review of systems is negative     Past Medical History:  Diagnosis Date   History of gestational diabetes    Hypertension    with pregnancy only   Migraine    migraines; Exedrine Migraine works    Past Surgical History:  Procedure Laterality Date   NO PAST SURGERIES     Social History   Socioeconomic History   Marital status: Married    Spouse name: Not on file   Number of children: 2   Years of education: 15    Highest education level: Some college, no degree  Occupational History   Occupation: [employer]  Tobacco Use   Smoking status: Former    Types: Cigarettes    Quit date: 08/24/2013    Years since quitting: 7.1   Smokeless tobacco: Never  Vaping Use   Vaping Use: Never used  Substance and Sexual Activity   Alcohol use: Yes    Alcohol/week: 0.0 standard drinks   Drug use: No   Sexual activity: Yes    Birth control/protection: None  Other Topics Concern   Not on file  Social History Narrative   G2P2   First baby born July 2017, June 2022      Right handed   Caffeine: 16 oz daily   Social Determinants of Health   Financial Resource Strain: Not on file  Food Insecurity: Not on file  Transportation Needs: Not on file  Physical Activity: Not on file  Stress: Not on file  Social Connections: Not on file  Intimate Partner  Violence: Not on file    Allergies as of 10/04/2020   No Known Allergies      Medication List        Accurate as of October 04, 2020 11:59 PM. If you have any questions, ask your nurse or doctor.          STOP taking these medications    amLODipine 2.5 MG tablet Commonly known as: NORVASC Stopped by: Willow Ora, MD   azelastine 0.1 % nasal spray Commonly known as: ASTELIN Stopped by: Willow Ora, MD   EXCEDRIN PO Stopped by: Willow Ora, MD   HYDROcodone-homatropine 5-1.5 MG/5ML syrup Commonly known as: HYCODAN Stopped by: Willow Ora, MD   levocetirizine 5 MG tablet Commonly known as: XYZAL Stopped by: Willow Ora, MD   levonorgestrel-ethinyl estradiol 0.1-20 MG-MCG tablet Commonly known as: ALESSE Stopped by: Willow Ora, MD   metFORMIN 500 MG tablet Commonly known as: GLUCOPHAGE Stopped by: Willow Ora, MD   methocarbamol 500 MG tablet Commonly known as: ROBAXIN Stopped by: Willow Ora, MD       TAKE these medications    acetaminophen 500 MG tablet Commonly known as: TYLENOL Take 500 mg by mouth as needed.   EPINEPHrine 0.3 mg/0.3 mL Soaj injection Commonly known as:  EPI-PEN Inject 0.3 mg into the muscle once as needed.   naproxen sodium 220 MG tablet Commonly known as: Aleve Take 1-2 tablets (220-440 mg total) by mouth 2 (two) times daily as needed (pain).   NIFEdipine 30 MG 24 hr tablet Commonly known as: ADALAT CC Take 30 mg by mouth daily.   SUMAtriptan 50 MG tablet Commonly known as: Imitrex Take 1 tablet (50 mg total) by mouth every 2 (two) hours as needed for migraine. No more than 2 tabs in a 24 hour period           Objective:   Physical Exam BP 122/80 (BP Location: Left Arm, Patient Position: Sitting, Cuff Size: Normal)   Pulse 84   Temp 98 F (36.7 C) (Oral)   Resp 16   Ht 5\' 6"  (1.676 m)   Wt 220 lb 4 oz (99.9 kg)   SpO2 98%   Breastfeeding Yes Comment: pumping  BMI 35.55 kg/m  General: Well developed, NAD, BMI noted Neck: No   thyromegaly  HEENT:  Normocephalic . Face symmetric, atraumatic Lungs:  CTA B Normal respiratory effort, no intercostal retractions, no accessory muscle use. Heart: RRR,  no murmur.  Abdomen:  Not distended, soft, non-tender. No rebound or rigidity.   Lower extremities: no pretibial edema bilaterally  Skin: Exposed areas without rash. Not pale. Not jaundice Neurologic:  alert & oriented X3.  Speech normal, gait appropriate for age and unassisted Strength symmetric and appropriate for age.  Psych: Cognition and judgment appear intact.  Cooperative with normal attention span and concentration.  Behavior appropriate. No anxious or depressed appearing.     Assessment    Assessment HTN Migraines CT head (-) 2008, MRI (-) 2012, h/o response to imitrex ; ER 08/2017, 09/2017 CT head (-) on each visit  H/o  Gestational diabetes  Plan: Here for CPX Postpartum 3 months.  Breast-feeding.  On Depo shot, to switch to BCPS 10/21/2020. HTN: During the third trimester of her pregnancy, BP was elevated, she has been on nifedipine all along, BP when checked after delivery is okay.  No change.  Labs. Gestational DM: CBGs were elevated during pregnancy, particularly fasting readings, she did take insulin.  After the delivery, she continue checking her CBGs for a while, and they were decreasing.  No recent CBGs.  Not taking insulin.  Check A1c and micro. Chest pain: Symptoms do not fit on any syndrome specifically, see HPI.  Rec observation. RTC 6 months (BP check, blood sugar follow-up)   This visit occurred during the SARS-CoV-2 public health emergency.  Safety protocols were in place, including screening questions prior to the visit, additional usage of staff PPE, and extensive cleaning of exam room while observing appropriate contact time as indicated for disinfecting solutions.

## 2020-10-05 ENCOUNTER — Encounter: Payer: Self-pay | Admitting: Internal Medicine

## 2020-10-05 LAB — CBC WITH DIFFERENTIAL/PLATELET
Basophils Absolute: 0.1 10*3/uL (ref 0.0–0.1)
Basophils Relative: 1 % (ref 0.0–3.0)
Eosinophils Absolute: 0.2 10*3/uL (ref 0.0–0.7)
Eosinophils Relative: 1.8 % (ref 0.0–5.0)
HCT: 42 % (ref 36.0–46.0)
Hemoglobin: 13.9 g/dL (ref 12.0–15.0)
Lymphocytes Relative: 27.8 % (ref 12.0–46.0)
Lymphs Abs: 2.3 10*3/uL (ref 0.7–4.0)
MCHC: 33 g/dL (ref 30.0–36.0)
MCV: 91.4 fl (ref 78.0–100.0)
Monocytes Absolute: 0.5 10*3/uL (ref 0.1–1.0)
Monocytes Relative: 6 % (ref 3.0–12.0)
Neutro Abs: 5.2 10*3/uL (ref 1.4–7.7)
Neutrophils Relative %: 63.4 % (ref 43.0–77.0)
Platelets: 328 10*3/uL (ref 150.0–400.0)
RBC: 4.6 Mil/uL (ref 3.87–5.11)
RDW: 14 % (ref 11.5–15.5)
WBC: 8.2 10*3/uL (ref 4.0–10.5)

## 2020-10-05 LAB — COMPREHENSIVE METABOLIC PANEL
ALT: 32 U/L (ref 0–35)
AST: 37 U/L (ref 0–37)
Albumin: 4.3 g/dL (ref 3.5–5.2)
Alkaline Phosphatase: 89 U/L (ref 39–117)
BUN: 16 mg/dL (ref 6–23)
CO2: 28 mEq/L (ref 19–32)
Calcium: 10.1 mg/dL (ref 8.4–10.5)
Chloride: 103 mEq/L (ref 96–112)
Creatinine, Ser: 0.79 mg/dL (ref 0.40–1.20)
GFR: 96.54 mL/min (ref >60.00)
Glucose, Bld: 131 mg/dL — ABNORMAL HIGH (ref 70–99)
Potassium: 4.4 mEq/L (ref 3.5–5.1)
Sodium: 140 mEq/L (ref 135–145)
Total Bilirubin: 1 mg/dL (ref 0.2–1.2)
Total Protein: 7.3 g/dL (ref 6.0–8.3)

## 2020-10-05 LAB — LIPID PANEL
Cholesterol: 245 mg/dL — ABNORMAL HIGH (ref 0–200)
HDL: 60 mg/dL (ref >39.00)
NonHDL: 184.57
Total CHOL/HDL Ratio: 4
Triglycerides: 230 mg/dL — ABNORMAL HIGH (ref 0.0–149.0)
VLDL: 46 mg/dL — ABNORMAL HIGH (ref 0.0–40.0)

## 2020-10-05 LAB — TSH: TSH: 1.45 u[IU]/mL (ref 0.35–5.50)

## 2020-10-05 LAB — LDL CHOLESTEROL, DIRECT: Direct LDL: 168 mg/dL

## 2020-10-05 LAB — HEMOGLOBIN A1C: Hgb A1c MFr Bld: 6.2 % (ref 4.6–6.5)

## 2020-10-05 NOTE — Assessment & Plan Note (Signed)
Here for CPX Postpartum 3 months.  Breast-feeding.  On Depo shot, to switch to BCPS 10/21/2020. HTN: During the third trimester of her pregnancy, BP was elevated, she has been on nifedipine all along, BP when checked after delivery is okay.  No change.  Labs. Gestational DM: CBGs were elevated during pregnancy, particularly fasting readings, she did take insulin.  After the delivery, she continue checking her CBGs for a while, and they were decreasing.  No recent CBGs.  Not taking insulin.  Check A1c and micro. Chest pain: Symptoms do not fit on any syndrome specifically, see HPI.  Rec observation. RTC 6 months (BP check, blood sugar follow-up)

## 2020-10-05 NOTE — Assessment & Plan Note (Signed)
-  Td 2016 -COVID vaccines: Rec booster. - Flu shot yearly recommended - Female care: Per gynecology -Diet and exercise discussed. -Labs: CMP, FLP, CBC, A1c, micro, TSH

## 2020-10-19 DIAGNOSIS — I10 Essential (primary) hypertension: Secondary | ICD-10-CM | POA: Diagnosis not present

## 2020-10-19 DIAGNOSIS — R0789 Other chest pain: Secondary | ICD-10-CM | POA: Diagnosis not present

## 2020-10-19 DIAGNOSIS — R918 Other nonspecific abnormal finding of lung field: Secondary | ICD-10-CM | POA: Diagnosis not present

## 2020-10-19 DIAGNOSIS — R7989 Other specified abnormal findings of blood chemistry: Secondary | ICD-10-CM | POA: Diagnosis not present

## 2020-10-19 DIAGNOSIS — J9811 Atelectasis: Secondary | ICD-10-CM | POA: Diagnosis not present

## 2020-10-19 DIAGNOSIS — R079 Chest pain, unspecified: Secondary | ICD-10-CM | POA: Diagnosis not present

## 2020-10-20 DIAGNOSIS — R918 Other nonspecific abnormal finding of lung field: Secondary | ICD-10-CM | POA: Diagnosis not present

## 2020-10-20 DIAGNOSIS — J9811 Atelectasis: Secondary | ICD-10-CM | POA: Diagnosis not present

## 2020-10-20 DIAGNOSIS — R0789 Other chest pain: Secondary | ICD-10-CM | POA: Diagnosis not present

## 2020-10-20 DIAGNOSIS — R079 Chest pain, unspecified: Secondary | ICD-10-CM | POA: Diagnosis not present

## 2020-11-08 DIAGNOSIS — I10 Essential (primary) hypertension: Secondary | ICD-10-CM | POA: Diagnosis not present

## 2020-11-08 DIAGNOSIS — E782 Mixed hyperlipidemia: Secondary | ICD-10-CM | POA: Diagnosis not present

## 2020-11-08 DIAGNOSIS — E6609 Other obesity due to excess calories: Secondary | ICD-10-CM | POA: Diagnosis not present

## 2020-11-08 DIAGNOSIS — R519 Headache, unspecified: Secondary | ICD-10-CM | POA: Diagnosis not present

## 2020-11-08 DIAGNOSIS — Z6837 Body mass index (BMI) 37.0-37.9, adult: Secondary | ICD-10-CM | POA: Diagnosis not present

## 2020-12-29 DIAGNOSIS — R079 Chest pain, unspecified: Secondary | ICD-10-CM | POA: Diagnosis not present

## 2020-12-29 DIAGNOSIS — I10 Essential (primary) hypertension: Secondary | ICD-10-CM | POA: Diagnosis not present

## 2021-04-03 ENCOUNTER — Ambulatory Visit: Payer: BC Managed Care – PPO | Admitting: Internal Medicine
